# Patient Record
Sex: Male | Born: 1960
Health system: Southern US, Community
[De-identification: ages and names within clinical notes are randomized; demographics above are authoritative.]

## PROBLEM LIST (undated history)

## (undated) DIAGNOSIS — T7840XA Allergy, unspecified, initial encounter: Secondary | ICD-10-CM

## (undated) HISTORY — PX: TONSILLECTOMY: SUR1361

## (undated) HISTORY — DX: Allergy, unspecified, initial encounter: T78.40XA

---
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
## 2006-07-26 ENCOUNTER — Ambulatory Visit: Payer: Self-pay | Admitting: Urology

## 2008-04-24 IMAGING — CR DG IVP HYPERTENSIVE
1 series · 8 of 10 positions shown · non-contrast
Comparison: none

REASON FOR EXAM: UTI
COMMENTS:

[Series 1: view not recorded · 0.17mm/px · 8 of 13 slices shown]
[im 1/13]
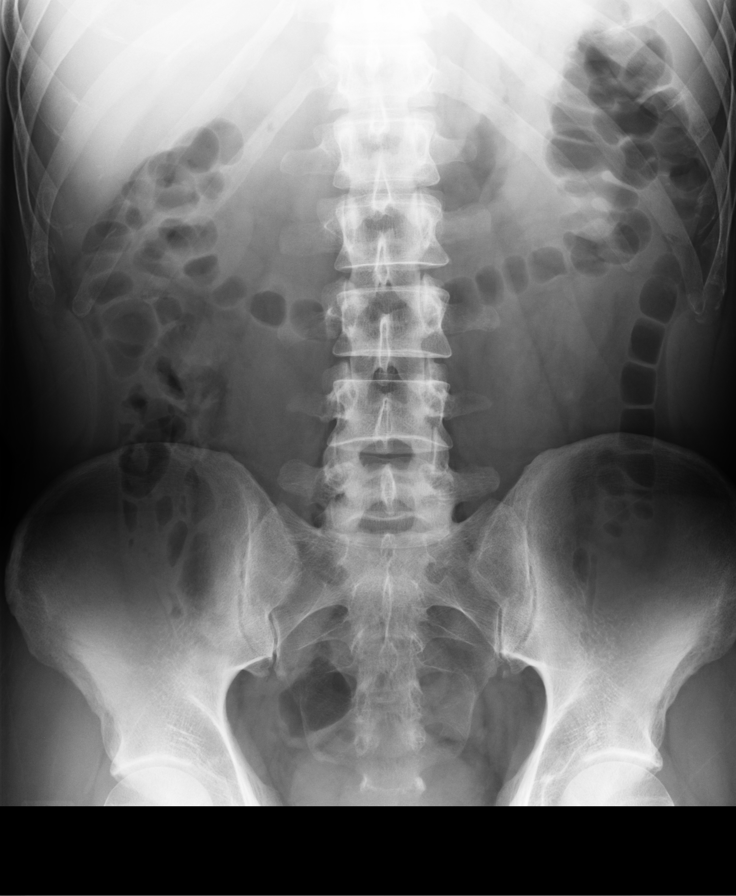
[im 2/13]
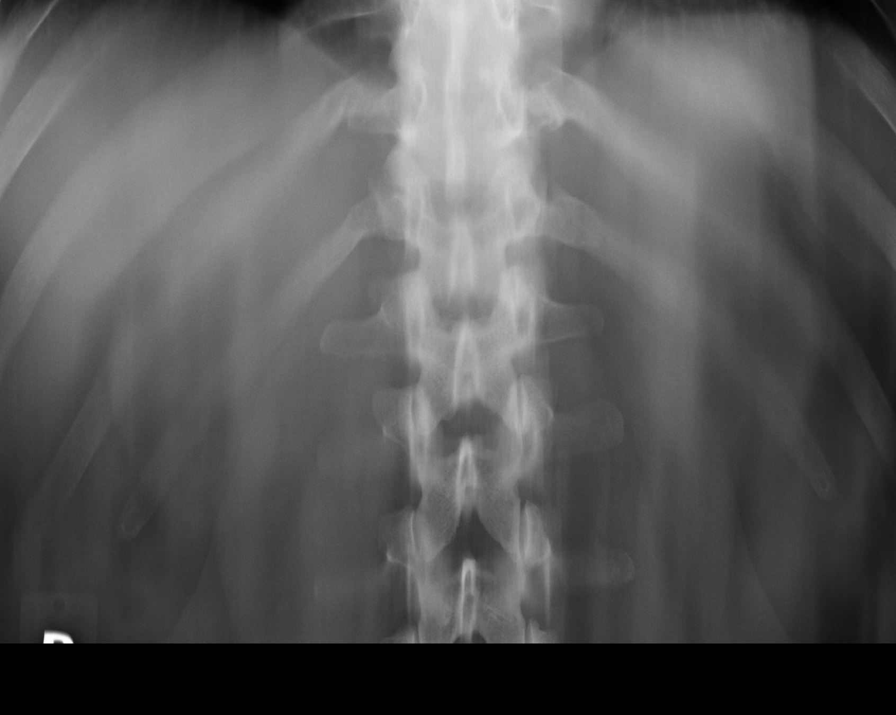
[im 3/13]
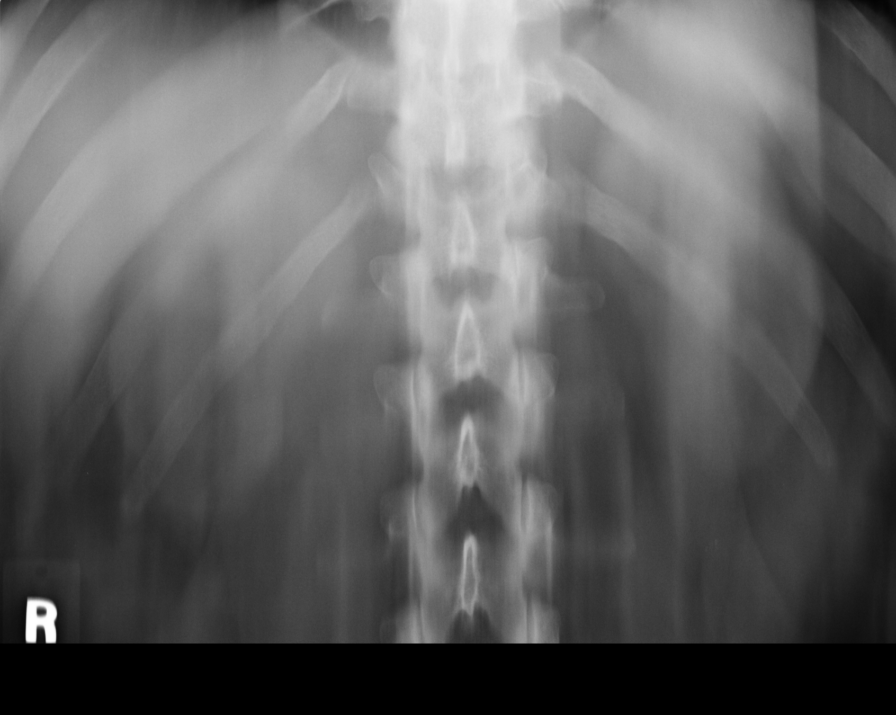
[im 5/13]
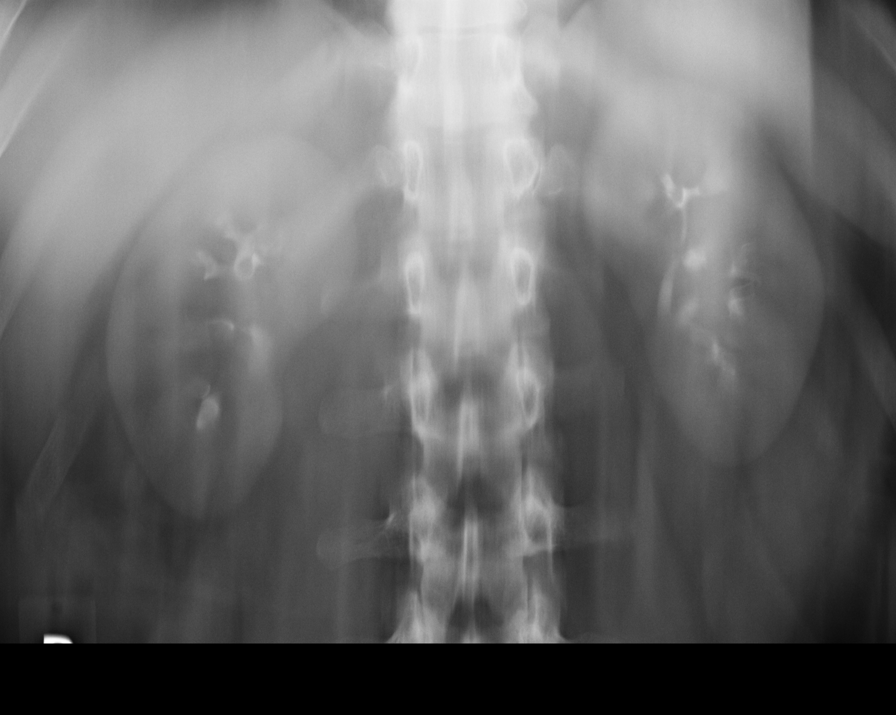
[im 6/13]
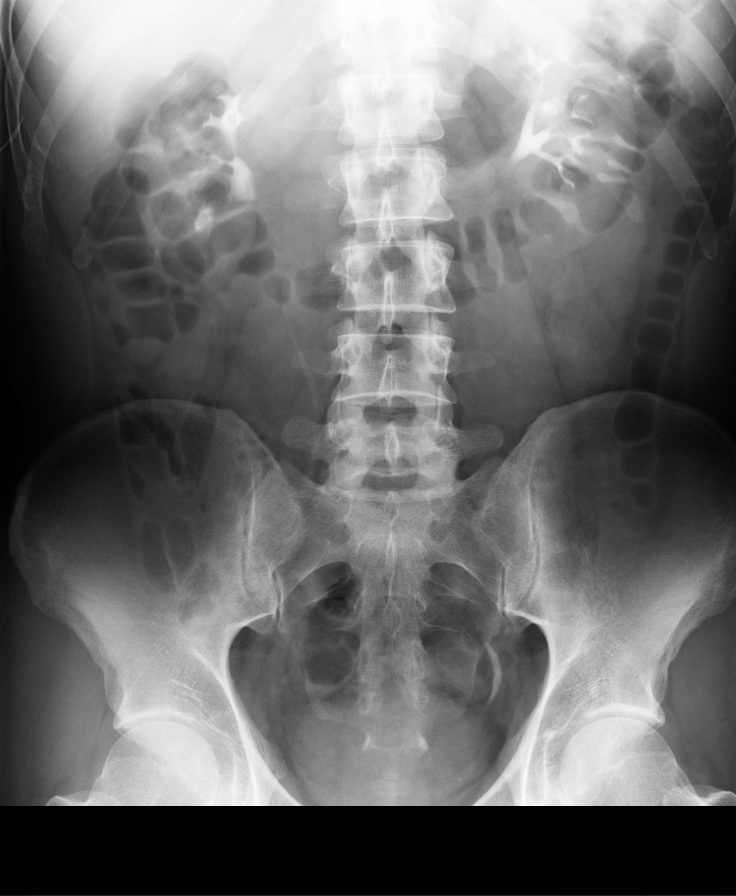
[im 7/13]
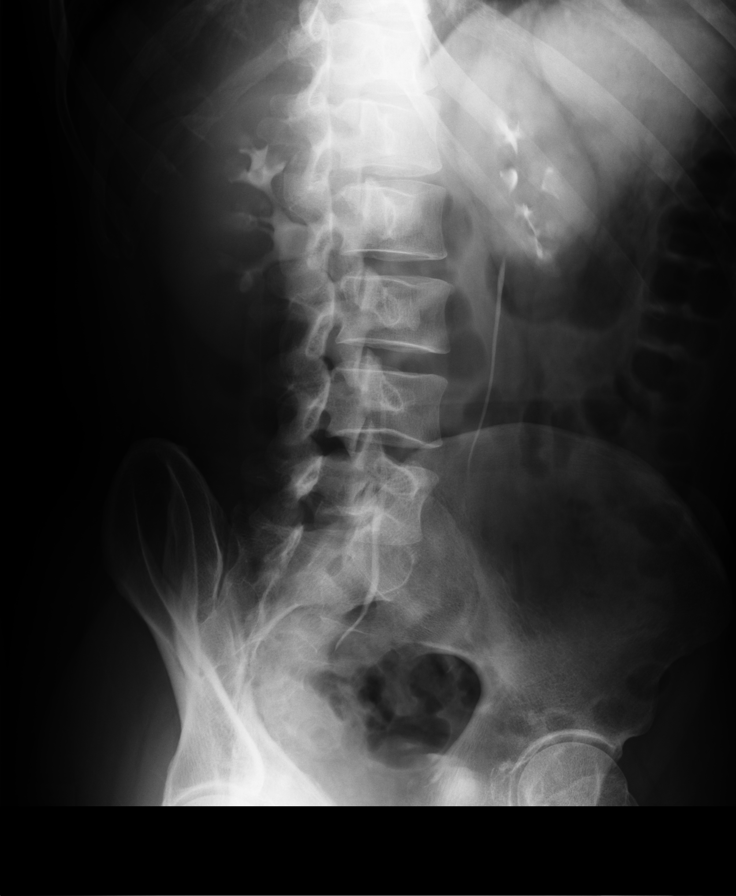
[im 9/13]
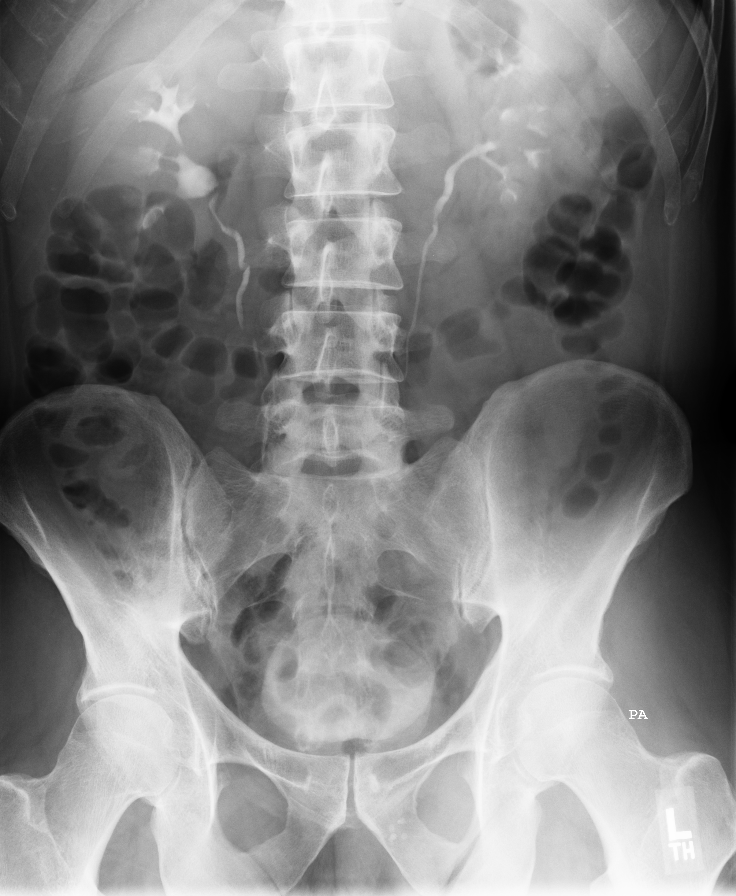
[im 10/13]
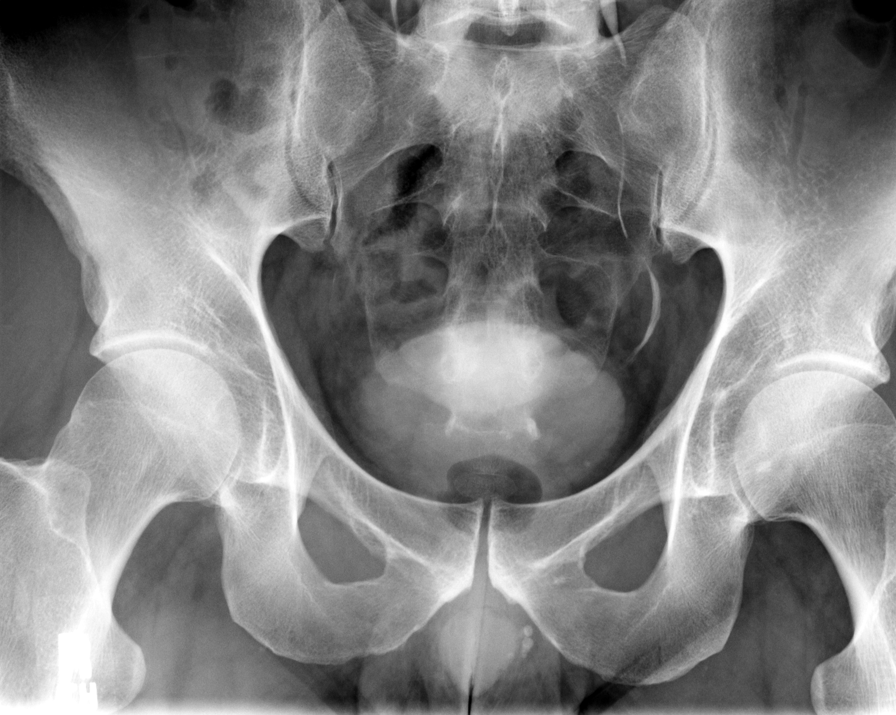

[8 of 10 positions shown; findings below may reference images not displayed]

PROCEDURE:     DXR - DXR INTRAVENOUS UROGRAPHY (IVP)  - July 26, 2006 [DATE]

RESULT:       Scout film reveals no abnormal calcifications. Post
intravenous injection of contrast, there is prompt excretion bilaterally.
The kidneys appear normal in size, shape and attitude.  The calices appear
sharp.  No blunting is seen.  The ureters appear within normal limits.   The
urinary bladder is well filled with no intrinsic abnormality seen.  On the
post-void film there is some residual contrast in the urinary bladder.  The
upper collecting system drains well.
IMPRESSION: No significant abnormalities noted on the IVP study.

## 2010-05-16 HISTORY — PX: SKIN LESION EXCISION: SHX2412

## 2014-02-14 ENCOUNTER — Encounter: Payer: Self-pay | Admitting: *Deleted

## 2014-02-20 ENCOUNTER — Ambulatory Visit (INDEPENDENT_AMBULATORY_CARE_PROVIDER_SITE_OTHER): Payer: Federal, State, Local not specified - PPO | Admitting: General Surgery

## 2014-02-20 ENCOUNTER — Encounter: Payer: Self-pay | Admitting: General Surgery

## 2014-02-20 VITALS — BP 136/68 | HR 72 | Resp 14 | Ht 70.0 in | Wt 181.0 lb

## 2014-02-20 DIAGNOSIS — L0211 Cutaneous abscess of neck: Secondary | ICD-10-CM

## 2014-02-20 DIAGNOSIS — L03221 Cellulitis of neck: Secondary | ICD-10-CM

## 2014-02-20 NOTE — Progress Notes (Signed)
Patient ID: Mark Catholic., male   DOB: 1960-12-09, 53 y.o.   MRN: [id number]  Chief Complaint  Patient presents with  . Cyst    left side of neck    HPI Mark Hill. is a 53 y.o. male.  Here today for evaluation of a boil on left side of his neck. States he noticed it a couple of months ago. Dermatology evaluated it and said it was a "cyst". Over the past two weeks it has gotten larger with occasional pain.  HPI  Past Medical History  Diagnosis Date  . Allergy     seasonal    Past Surgical History  Procedure Laterality Date  . Skin lesion excision  05-16-2010    back cyst    Family History  Problem Relation Age of Onset  . Diabetes Brother     Social History History  Substance Use Topics  . Smoking status: Never Smoker   . Smokeless tobacco: Never Used  . Alcohol Use: Yes     Comment: ocassionally    No Known Allergies  Current Outpatient Prescriptions  Medication Sig Dispense Refill  . ibuprofen (ADVIL,MOTRIN) 200 MG tablet Take 200 mg by mouth every 6 (six) hours as needed.      . saw palmetto 160 MG capsule Take 160 mg by mouth 3 (three) times daily.       No current facility-administered medications for this visit.    Review of Systems Review of Systems  Constitutional: Negative.   Respiratory: Negative.   Cardiovascular: Negative.     Blood pressure 136/68, pulse 72, resp. rate 14, height 5\' 10"  (1.778 m), weight 181 lb (82.101 kg).  Physical Exam Physical Exam  Constitutional: He is oriented to person, place, and time. He appears well-developed and well-nourished.  Neck:    Neurological: He is alert and oriented to person, place, and time.  Skin: Skin is warm and dry.  3 x 4 cm inflamed area base of left neck over SCM    Data Reviewed Back cyst excised August 2011 showed a ruptured follicular cyst, infundibular type.    Assessment    Inflamed dermal cyst.     Plan    Incision and drainage was recommended based on the  degree of overlying erythema. 10 cc of 0.5% Xylocaine with 0.25% Marcaine with 1-200,000 units of epinephrine was utilized a well tolerated. The area was prepped with ChloraPrep draped. A skin line incision was made over the mass about 2-1/2 cc of thick green pus obtained. Evidence of old cyst contents were removed. The area was cleansed with peroxide. Scant bleeding was noted.  A dry dressing was applied. The procedure was well tolerated.  Wound care was reviewed. If this heals without any residual thickening or discomfort reevaluation and formal excision of the cyst wall will not be required. The patient will notify the office of his progress. Follow up is not mandatory if he is doing well.     PCP: Mitchel Honour Jenae Tomasello 02/20/2014, 8:59 PM

## 2014-02-20 NOTE — Patient Instructions (Addendum)
The patient is aware to call back for any questions or concerns.  

## 2014-02-25 LAB — ANAEROBIC AND AEROBIC CULTURE

## 2014-03-08 ENCOUNTER — Ambulatory Visit: Payer: Self-pay | Admitting: General Surgery

## 2015-03-28 ENCOUNTER — Encounter: Payer: Self-pay | Admitting: *Deleted

## 2015-03-29 ENCOUNTER — Encounter: Payer: Self-pay | Admitting: *Deleted

## 2015-03-29 ENCOUNTER — Ambulatory Visit
Admission: RE | Admit: 2015-03-29 | Discharge: 2015-03-29 | Disposition: A | Discharge status: Home / Self Care | Payer: Federal, State, Local not specified - PPO | Source: Ambulatory Visit | Attending: Gastroenterology | Admitting: Gastroenterology

## 2015-03-29 ENCOUNTER — Encounter
Admission: RE | Disposition: A | Discharge status: Home / Self Care | Payer: Self-pay | Source: Ambulatory Visit | Attending: Gastroenterology

## 2015-03-29 ENCOUNTER — Ambulatory Visit: Payer: Federal, State, Local not specified - PPO | Admitting: Anesthesiology

## 2015-03-29 DIAGNOSIS — Z791 Long term (current) use of non-steroidal anti-inflammatories (NSAID): Secondary | ICD-10-CM | POA: Diagnosis not present

## 2015-03-29 DIAGNOSIS — D122 Benign neoplasm of ascending colon: Secondary | ICD-10-CM | POA: Insufficient documentation

## 2015-03-29 DIAGNOSIS — Z1211 Encounter for screening for malignant neoplasm of colon: Secondary | ICD-10-CM | POA: Diagnosis not present

## 2015-03-29 DIAGNOSIS — D124 Benign neoplasm of descending colon: Secondary | ICD-10-CM | POA: Insufficient documentation

## 2015-03-29 HISTORY — PX: COLONOSCOPY: SHX5424

## 2015-03-29 SURGERY — COLONOSCOPY
Anesthesia: General

## 2015-03-29 MED ORDER — LIDOCAINE HCL (CARDIAC) 20 MG/ML IV SOLN
INTRAVENOUS | Status: DC | PRN
  Administered 2015-03-29: 60 mg via INTRAVENOUS

## 2015-03-29 MED ORDER — PHENYLEPHRINE HCL 10 MG/ML IJ SOLN
INTRAMUSCULAR | Status: DC | PRN
  Administered 2015-03-29 (×4): 100 ug via INTRAVENOUS

## 2015-03-29 MED ORDER — SODIUM CHLORIDE 0.9 % IV SOLN
INTRAVENOUS | Status: DC | PRN
  Administered 2015-03-29: 11:00:00 via INTRAVENOUS

## 2015-03-29 MED ORDER — PROPOFOL INFUSION 10 MG/ML OPTIME
INTRAVENOUS | Status: DC | PRN
  Administered 2015-03-29: 120 ug/kg/min via INTRAVENOUS

## 2015-03-29 MED ORDER — SODIUM CHLORIDE 0.9 % IV SOLN: INTRAVENOUS | Status: DC

## 2015-03-29 MED ORDER — PROPOFOL 10 MG/ML IV BOLUS
INTRAVENOUS | Status: DC | PRN
  Administered 2015-03-29: 50 mg via INTRAVENOUS

## 2015-03-29 MED ORDER — SODIUM CHLORIDE 0.9 % IV SOLN
INTRAVENOUS | Status: DC
  Administered 2015-03-29: 1000 mL via INTRAVENOUS

## 2015-03-29 MED ORDER — MIDAZOLAM HCL 2 MG/2ML IJ SOLN
INTRAMUSCULAR | Status: DC | PRN
  Administered 2015-03-29: 2 mg via INTRAVENOUS

## 2015-03-29 NOTE — Anesthesia Preprocedure Evaluation (Signed)
Anesthesia Evaluation  Patient identified by MRN, date of birth, ID band Patient awake    Reviewed: Allergy & Precautions, NPO status , Patient's Chart, lab work & pertinent test results  Airway Mallampati: II  TM Distance: >3 FB Neck ROM: Full    Dental no notable dental hx.    Pulmonary  sinus   Pulmonary exam normal       Cardiovascular negative cardio ROS Normal cardiovascular exam    Neuro/Psych negative neurological ROS  negative psych ROS   GI/Hepatic negative GI ROS, Neg liver ROS,   Endo/Other  negative endocrine ROS  Renal/GU negative Renal ROS  negative genitourinary   Musculoskeletal negative musculoskeletal ROS (+)   Abdominal Normal abdominal exam  (+)   Peds negative pediatric ROS (+)  Hematology negative hematology ROS (+)   Anesthesia Other Findings   Reproductive/Obstetrics                             Anesthesia Physical Anesthesia Plan  ASA: II  Anesthesia Plan: General   Post-op Pain Management:    Induction: Intravenous  Airway Management Planned: Nasal Cannula  Additional Equipment:   Intra-op Plan:   Post-operative Plan:   Informed Consent: I have reviewed the patients History and Physical, chart, labs and discussed the procedure including the risks, benefits and alternatives for the proposed anesthesia with the patient or authorized representative who has indicated his/her understanding and acceptance.   Dental advisory given  Plan Discussed with: CRNA and Surgeon  Anesthesia Plan Comments:         Anesthesia Quick Evaluation

## 2015-03-29 NOTE — Anesthesia Postprocedure Evaluation (Signed)
  Anesthesia Post-op Note  Patient: Mark Hill.  Procedure(s) Performed: Procedure(s): COLONOSCOPY (N/A)  Anesthesia type:General  Patient location: PACU  Post pain: Pain level controlled  Post assessment: Post-op Vital signs reviewed, Patient's Cardiovascular Status Stable, Respiratory Function Stable, Patent Airway and No signs of Nausea or vomiting  Post vital signs: Reviewed and stable  Last Vitals:  Filed Vitals:   03/29/15 1240  BP: 121/82  Pulse: 70  Temp:   Resp: 16    Level of consciousness: awake, alert  and patient cooperative  Complications: No apparent anesthesia complications

## 2015-03-29 NOTE — Op Note (Signed)
[facility] Gastroenterology Patient Name: Mark Hill Procedure Date: 03/29/2015 11:12 AM MRN: [id number] Account #: [id number] Date of Birth: Mar 20, 1961 Admit Type: Outpatient Age: 54 Room: [facility] ENDO ROOM 4 Gender: Male Note Status: Finalized Procedure:         Colonoscopy Indications:       Screening for colorectal malignant neoplasm Providers:         Lollie Sails, MD Referring MD:      Janine Ores. Rosanna Randy, MD (Referring MD), Kirstie Peri. Caryn Section,                     MD (Referring MD) Medicines:         Monitored Anesthesia Care Complications:     No immediate complications. Procedure:         Pre-Anesthesia Assessment:                    - ASA Grade Assessment: II - A patient with mild systemic                     disease.                    After obtaining informed consent, the colonoscope was                     passed under direct vision. Throughout the procedure, the                     patient's blood pressure, pulse, and oxygen saturations                     were monitored continuously. The Colonoscope was                     introduced through the anus and advanced to the the cecum,                     identified by appendiceal orifice and ileocecal valve. The                     colonoscopy was performed without difficulty. The patient                     tolerated the procedure well. The quality of the bowel                     preparation was good. Findings:      A 3 mm polyp was found in the descending colon. The polyp was sessile.       The polyp was removed with a cold biopsy forceps. Resection and       retrieval were complete.      A 3 mm polyp was found in the proximal ascending colon. The polyp was       sessile. The polyp was removed with a cold biopsy forceps. Resection and       retrieval were complete.      A 12 mm polyp was found in the mid ascending colon. The polyp was       sessile. The polyp was removed with a cold biopsy  forceps. The polyp was       removed with a cold snare. The polyp was removed with a lift and cut       technique using a cold snare.  The polyp was removed with a piecemeal       technique using a cold snare. Resection and retrieval were complete.      Multiple small to medium diverticula were found in the sigmoid colon, in       the descending colon and in the ascending colon.      The digital rectal exam was normal.      The retroflexed view of the distal rectum and anal verge was normal and       showed no anal or rectal abnormalities. Impression:        - One 3 mm polyp in the descending colon. Resected and                     retrieved.                    - One 3 mm polyp in the proximal ascending colon. Resected                     and retrieved.                    - One 12 mm polyp in the mid ascending colon. Resected and                     retrieved. Recommendation:    - Await pathology results.                    - Telephone GI clinic for pathology results in 1 week. Procedure Code(s): --- Professional ---                    [phone], Colonoscopy, flexible; with removal of tumor(s),                     polyp(s), or other lesion(s) by snare technique                    45380, 72, Colonoscopy, flexible; with biopsy, single or                     multiple Diagnosis Code(s): --- Professional ---                    V76.51, Special screening for malignant neoplasms of colon                    211.3, Benign neoplasm of colon CPT copyright 2014 American Medical Association. All rights reserved. The codes documented in this report are preliminary and upon coder review may  be revised to meet current compliance requirements. Lollie Sails, MD 03/29/2015 11:58:41 AM This report has been signed electronically. Number of Addenda: 0 Note Initiated On: 03/29/2015 11:12 AM Scope Withdrawal Time: 0 hours 20 minutes 30 seconds  Total Procedure Duration: 0 hours 32 minutes 58 seconds        [facility]

## 2015-03-29 NOTE — H&P (Signed)
Outpatient short stay form Pre-procedure 03/29/2015 11:15 AM Lollie Sails MD  Primary Physician: Sheran Luz [facility]  Reason for visit:  Colonoscopy  History of present illness:  Patient is a 54 year old male presenting today for a screening colonoscopy. This is his first colonoscopy. He is not taking any anticoagulates or aspirin products. He tolerated his prep well.    Current facility-administered medications:  .  0.9 %  sodium chloride infusion, , Intravenous, Continuous, Lollie Sails, MD, Last Rate: 50 mL/hr at 03/29/15 1049, 1,000 mL at 03/29/15 1049  Prescriptions prior to admission  Medication Sig Dispense Refill Last Dose  . cetirizine (ZYRTEC) 10 MG tablet Take 10 mg by mouth daily.     Marland Kitchen ibuprofen (ADVIL,MOTRIN) 200 MG tablet Take 400 mg by mouth every 6 (six) hours as needed for headache.      . saw palmetto 160 MG capsule Take 160 mg by mouth 3 (three) times daily.        No Known Allergies   Past Medical History  Diagnosis Date  . Allergy     seasonal    Review of systems:      Physical Exam    Heart and lungs: Regular rate and rhythm without rub or gallop lungs are bilaterally clear    HEENT: Normocephalic atraumatic eyes are anicteric    Other:     Pertinant exam for procedure: Soft nontender nondistended bowel sounds positive normoactive    Planned proceedures: Colonoscopy and indicated procedures. I have discussed the risks benefits and complications of procedures to include not limited to bleeding, infection, perforation and the risk of sedation and the patient wishes to proceed.    Lollie Sails, MD Gastroenterology 03/29/2015  11:15 AM

## 2015-03-29 NOTE — Transfer of Care (Signed)
Immediate Anesthesia Transfer of Care Note  Patient: Mark Hill.  Procedure(s) Performed: Procedure(s): COLONOSCOPY (N/A)  Patient Location: Endoscopy Unit  Anesthesia Type:General  Level of Consciousness: awake, alert , oriented and patient cooperative  Airway & Oxygen Therapy: Patient Spontanous Breathing and Patient connected to nasal cannula oxygen  Post-op Assessment: Report given to RN, Post -op Vital signs reviewed and stable and Patient moving all extremities X 4  Post vital signs: Reviewed and stable  Last Vitals:  Filed Vitals:   03/29/15 1201  BP: 115/75  Pulse: 73  Temp: 36.5 C  Resp: 14    Complications: No apparent anesthesia complications

## 2015-04-01 ENCOUNTER — Encounter: Payer: Self-pay | Admitting: Gastroenterology

## 2015-04-01 LAB — SURGICAL PATHOLOGY

## 2015-07-30 ENCOUNTER — Ambulatory Visit (INDEPENDENT_AMBULATORY_CARE_PROVIDER_SITE_OTHER): Payer: Federal, State, Local not specified - PPO | Admitting: Family Medicine

## 2015-07-30 ENCOUNTER — Encounter: Payer: Self-pay | Admitting: Family Medicine

## 2015-07-30 VITALS — BP 104/60 | HR 68 | Temp 99.9°F | Resp 16 | Wt 190.0 lb

## 2015-07-30 DIAGNOSIS — Z125 Encounter for screening for malignant neoplasm of prostate: Secondary | ICD-10-CM | POA: Diagnosis not present

## 2015-07-30 DIAGNOSIS — N390 Urinary tract infection, site not specified: Secondary | ICD-10-CM | POA: Diagnosis not present

## 2015-07-30 DIAGNOSIS — N3090 Cystitis, unspecified without hematuria: Secondary | ICD-10-CM

## 2015-07-30 LAB — POCT URINALYSIS DIPSTICK
Bilirubin, UA: NEGATIVE
Glucose, UA: NEGATIVE
Ketones, UA: NEGATIVE
NITRITE UA: NEGATIVE
PH UA: 8.5
Protein, UA: 30
Urobilinogen, UA: 0.2

## 2015-07-30 MED ORDER — CIPROFLOXACIN HCL 500 MG PO TABS: 500.0000 mg | ORAL_TABLET | Freq: Two times a day (BID) | ORAL | Status: AC

## 2015-07-30 NOTE — Progress Notes (Signed)
       Patient: Mark Hill. Male    DOB: [birth year]   54 y.o.   MRN: [id number] Visit Date: 07/30/2015  Today's Provider: Lelon Huh, MD   Chief Complaint  Patient presents with  . Urinary Frequency  . Chills   Subjective:    HPI 2 days history urinary frequency, urgency, and hesitancy. Had one episode of vomiting last night, but since then has had no nausea or other abdominal pains. Has had some mild chills and sweats. No back pain. He does have a history of recurrent prostatitis and UTIs.    No Known Allergies Previous Medications   CETIRIZINE (ZYRTEC) 10 MG TABLET    Take 10 mg by mouth daily.   IBUPROFEN (ADVIL,MOTRIN) 200 MG TABLET    Take 400 mg by mouth every 6 (six) hours as needed for headache.    SAW PALMETTO 160 MG CAPSULE    Take 160 mg by mouth 3 (three) times daily.    Review of Systems  Constitutional: Positive for fever.  Cardiovascular: Negative for chest pain and palpitations.  Gastrointestinal: Negative for abdominal pain.  Genitourinary: Positive for urgency, frequency, decreased urine volume and difficulty urinating. Negative for flank pain.  Neurological: Negative for dizziness, light-headedness and headaches.    Social History  Substance Use Topics  . Smoking status: Never Smoker   . Smokeless tobacco: Never Used  . Alcohol Use: Yes     Comment: ocassionally   Objective:   BP 104/60 mmHg  Pulse 68  Temp(Src) 99.9 F (37.7 C) (Oral)  Resp 16  Wt 190 lb (86.183 kg)  SpO2 97%     Physical Exam   General Appearance:    Alert, cooperative, no distress  Eyes:    PERRL, conjunctiva/corneas clear, EOM's intact       Lungs:     Clear to auscultation bilaterally, respirations unlabored  Heart:    Regular rate and rhythm  Neurologic:   Awake, alert, oriented x 3. No apparent focal neurological           defect.   Abd:    Mild suprapubic tenderness, mild CVAT.    Results for orders placed or performed in visit on 07/30/15  POCT  urinalysis dipstick  Result Value Ref Range   Color, UA Dark Yellow    Clarity, UA Turbid    Glucose, UA Neg    Bilirubin, UA Neg    Ketones, UA Neg    Spec Grav, UA <=1.005    Blood, UA Trace    pH, UA 8.5    Protein, UA 30    Urobilinogen, UA 0.2    Nitrite, UA Neg    Leukocytes, UA large (3+) (A) Negative       Assessment & Plan:     1. Urinary tract infection without hematuria, site unspecified He does have long history of recurrent prostate and urinary tract infections and had evaluation by Dr. Bernardo Heater in 2007, but has now had 4 UTIs since July 2015. Will Check PSA when acute infection is cleared, and consider referral back to urology.  - POCT urinalysis dipstick - Urine culture - ciprofloxacin (CIPRO) 500 MG tablet; Take 1 tablet (500 mg total) by mouth 2 (two) times daily.  Dispense: 20 tablet; Refill: 0  2. Prostate cancer screening Will check PSA after finishing antibiotic and current symptoms have resolved.  - PSA       Lelon Huh, MD  [facility]

## 2015-07-30 NOTE — Addendum Note (Signed)
Addended by: Julieta Bellini on: 07/30/2015 11:27 AM   Modules accepted: Miquel Dunn

## 2015-08-01 LAB — URINE CULTURE

## 2015-08-20 ENCOUNTER — Telehealth: Payer: Self-pay | Admitting: Family Medicine

## 2015-08-20 LAB — PSA: PROSTATE SPECIFIC AG, SERUM: 4.8 ng/mL — AB (ref 0.0–4.0)

## 2015-08-20 NOTE — Telephone Encounter (Signed)
Pt is returning call.  CB#[phone]/MW

## 2015-08-21 NOTE — Telephone Encounter (Signed)
Patient returning call. Patient request a call back within 30 minutes or tomorrow after lunch due to work.

## 2015-08-21 NOTE — Telephone Encounter (Signed)
Patient notified

## 2015-08-21 NOTE — Telephone Encounter (Signed)
Returned call, no answer. Vm is full.

## 2015-08-21 NOTE — Telephone Encounter (Signed)
Pt is returning call.  CB#[phone]/MW

## 2015-12-02 ENCOUNTER — Encounter: Payer: Self-pay | Admitting: Family Medicine

## 2015-12-02 ENCOUNTER — Ambulatory Visit (INDEPENDENT_AMBULATORY_CARE_PROVIDER_SITE_OTHER): Payer: Federal, State, Local not specified - PPO | Admitting: Family Medicine

## 2015-12-02 VITALS — BP 110/60 | HR 94 | Temp 98.8°F | Resp 16 | Wt 193.0 lb

## 2015-12-02 DIAGNOSIS — N3090 Cystitis, unspecified without hematuria: Secondary | ICD-10-CM

## 2015-12-02 DIAGNOSIS — R300 Dysuria: Secondary | ICD-10-CM | POA: Diagnosis not present

## 2015-12-02 DIAGNOSIS — N3080 Other cystitis without hematuria: Secondary | ICD-10-CM | POA: Diagnosis not present

## 2015-12-02 LAB — POCT URINALYSIS DIPSTICK
Bilirubin, UA: NEGATIVE
Glucose, UA: NEGATIVE
Ketones, UA: NEGATIVE
Nitrite, UA: POSITIVE
PH UA: 6
Protein, UA: NEGATIVE
SPEC GRAV UA: 1.02
Urobilinogen, UA: 0.2

## 2015-12-02 MED ORDER — CIPROFLOXACIN HCL 500 MG PO TABS: 500.0000 mg | ORAL_TABLET | Freq: Two times a day (BID) | ORAL | Status: AC

## 2015-12-02 NOTE — Progress Notes (Signed)
Patient: Mark Hill. Male    DOB: December 28, 1960   55 y.o.   MRN: [id number] Visit Date: 12/02/2015  Today's Provider: Lelon Huh, MD   Chief Complaint  Patient presents with  . Dysuria   Subjective:    Dysuria  This is a new problem. The current episode started yesterday (last night). The problem occurs every urination. The problem has been unchanged. The patient is experiencing no pain. Maximum temperature: patient states he feels feverish. Pertinent negatives include no chills, discharge, flank pain, frequency, hematuria, hesitancy, nausea, sweats, urgency or vomiting. He has tried nothing for the symptoms.  Patient believes he has a prostate infection. He states he has had one in the past and this feels similar. Patient symptoms include cloudy urine with and odor and a slower urine stream. Patient denies any pain during urination.      No Known Allergies Previous Medications   CETIRIZINE (ZYRTEC) 10 MG TABLET    Take 10 mg by mouth daily.   IBUPROFEN (ADVIL,MOTRIN) 200 MG TABLET    Take 400 mg by mouth every 6 (six) hours as needed for headache.    SAW PALMETTO 160 MG CAPSULE    Take 160 mg by mouth 3 (three) times daily.    Review of Systems  Constitutional: Negative for fever, chills and appetite change.  Respiratory: Negative for chest tightness, shortness of breath and wheezing.   Cardiovascular: Negative for chest pain and palpitations.  Gastrointestinal: Negative for nausea, vomiting and abdominal pain.  Genitourinary: Positive for dysuria. Negative for hesitancy, urgency, frequency, hematuria and flank pain.    Social History  Substance Use Topics  . Smoking status: Never Smoker   . Smokeless tobacco: Never Used  . Alcohol Use: 0.0 oz/week    0 Standard drinks or equivalent per week     Comment: ocassionally on weekends   Objective:   BP 110/60 mmHg  Pulse 94  Temp(Src) 98.8 F (37.1 C) (Oral)  Resp 16  Wt 193 lb (87.544 kg)  SpO2  99%  Physical Exam  General appearance: alert, well developed, well nourished, cooperative and in no distress Head: Normocephalic, without obvious abnormality, atraumatic Abd: No CVAT Skin: Skin color, texture, turgor normal. No rashes seen  Psych: Appropriate mood and affect. Neurologic: Mental status: Alert, oriented to person, place, and time, thought content appropriate.   Results for orders placed or performed in visit on 12/02/15  POCT urinalysis dipstick  Result Value Ref Range   Color, UA yellow    Clarity, UA cloudy    Glucose, UA negative    Bilirubin, UA negative    Ketones, UA negative    Spec Grav, UA 1.020    Blood, UA Moderate (non hemolyzed)    pH, UA 6.0    Protein, UA negative    Urobilinogen, UA 0.2    Nitrite, UA positive    Leukocytes, UA small (1+) (A) Negative        Assessment & Plan:     1. Dysuria  - POCT urinalysis dipstick  2. Recurrent cystitis Before today most recent UTI was in November and PSA was mildly elevated at 4.9. He was advised to follow up with urologist, but his daughter is carrying twins and has had multiple medical problems and hospitalizations causing him to delay referral. He states he call for appointment with urologist in the next several weeks once his daughters pregnancy is over.  - Urine Culture - ciprofloxacin (CIPRO) 500 MG  tablet; Take 1 tablet (500 mg total) by mouth 2 (two) times daily.  Dispense: 20 tablet; Refill: 0       Lelon Huh, MD  [facility]

## 2015-12-04 ENCOUNTER — Other Ambulatory Visit: Payer: Self-pay | Admitting: Family Medicine

## 2015-12-04 LAB — URINE CULTURE

## 2015-12-04 MED ORDER — NITROFURANTOIN MONOHYD MACRO 100 MG PO CAPS: 100.0000 mg | ORAL_CAPSULE | Freq: Two times a day (BID) | ORAL | Status: AC

## 2015-12-18 ENCOUNTER — Other Ambulatory Visit: Payer: Self-pay | Admitting: Family Medicine

## 2015-12-18 MED ORDER — NITROFURANTOIN MACROCRYSTAL 100 MG PO CAPS: 100.0000 mg | ORAL_CAPSULE | Freq: Two times a day (BID) | ORAL | Status: DC

## 2015-12-18 NOTE — Telephone Encounter (Signed)
Pt states he was seen 2 weeks ago and was given a Rx to help with burning when voiding.  Pt states he has completed the Rx and is now having the symptoms again.  [facility].  CB#[phone]/MW

## 2015-12-18 NOTE — Telephone Encounter (Signed)
The infection was resistant to most antibiotics and will become untreatable if he doesn't see a urologist to take care of the underlying problem. Can try another round of nitrofurantoin 100mg  twice a day for 10 days, but he really needs to follow up with urology soon.

## 2015-12-18 NOTE — Telephone Encounter (Signed)
Patient was advised and expressed understanding. Patient requested rx be sent to pharmacy. Rx sent.

## 2016-01-08 DIAGNOSIS — N500 Atrophy of testis: Secondary | ICD-10-CM | POA: Diagnosis not present

## 2016-01-08 DIAGNOSIS — R3914 Feeling of incomplete bladder emptying: Secondary | ICD-10-CM | POA: Diagnosis not present

## 2016-01-08 DIAGNOSIS — R972 Elevated prostate specific antigen [PSA]: Secondary | ICD-10-CM | POA: Diagnosis not present

## 2016-01-08 DIAGNOSIS — N401 Enlarged prostate with lower urinary tract symptoms: Secondary | ICD-10-CM | POA: Diagnosis not present

## 2016-02-11 DIAGNOSIS — N401 Enlarged prostate with lower urinary tract symptoms: Secondary | ICD-10-CM | POA: Diagnosis not present

## 2016-02-14 DIAGNOSIS — R338 Other retention of urine: Secondary | ICD-10-CM | POA: Diagnosis not present

## 2016-02-14 DIAGNOSIS — N390 Urinary tract infection, site not specified: Secondary | ICD-10-CM | POA: Diagnosis not present

## 2016-02-14 DIAGNOSIS — N401 Enlarged prostate with lower urinary tract symptoms: Secondary | ICD-10-CM | POA: Diagnosis not present

## 2016-07-14 ENCOUNTER — Ambulatory Visit (INDEPENDENT_AMBULATORY_CARE_PROVIDER_SITE_OTHER): Payer: Federal, State, Local not specified - PPO | Admitting: Family Medicine

## 2016-07-14 ENCOUNTER — Encounter: Payer: Self-pay | Admitting: Family Medicine

## 2016-07-14 VITALS — BP 120/70 | HR 71 | Temp 98.1°F | Resp 16 | Wt 188.0 lb

## 2016-07-14 DIAGNOSIS — N390 Urinary tract infection, site not specified: Secondary | ICD-10-CM

## 2016-07-14 DIAGNOSIS — R350 Frequency of micturition: Secondary | ICD-10-CM | POA: Diagnosis not present

## 2016-07-14 LAB — POCT URINALYSIS DIPSTICK
Bilirubin, UA: NEGATIVE
Glucose, UA: NEGATIVE
Ketones, UA: NEGATIVE
Nitrite, UA: POSITIVE
Spec Grav, UA: 1.02
Urobilinogen, UA: NEGATIVE
pH, UA: 6

## 2016-07-14 MED ORDER — AMOXICILLIN-POT CLAVULANATE 875-125 MG PO TABS: 1.0000 | ORAL_TABLET | Freq: Two times a day (BID) | ORAL | 0 refills | Status: AC

## 2016-07-14 NOTE — Progress Notes (Signed)
Patient: Mark Hill. Male    DOB: 12-Dec-1960   55 y.o.   MRN: [id number] Visit Date: 07/14/2016  Today's Provider: Lelon Huh, MD   Chief Complaint  Patient presents with  . Urinary Frequency   Subjective:    Patient has burning and frequency with urination. Symptoms started 07/12/16. Patient stated that his symptoms have improved somewhat. Patient does see Dr Eliberto Ivory who diagnosis patient with a slightly enlarged prostate.     Urinary Frequency   This is a recurrent problem. The current episode started in the past 7 days (2 days). The problem occurs every urination. The problem has been gradually improving. The quality of the pain is described as burning. The pain is at a severity of 5/10. The pain is moderate. The maximum temperature recorded prior to his arrival was 100 - 100.9 F. Associated symptoms include frequency. Pertinent negatives include no chills, discharge, flank pain, hematuria, hesitancy, nausea, possible pregnancy, sweats, urgency or vomiting. slightly enlarge prostate   He has history recurrent UTI secondary to BPH followed by Dr. Eliberto Ivory, improve since started on Rapaflox and finasteride.    No Known Allergies   Current Outpatient Prescriptions:  .  cetirizine (ZYRTEC) 10 MG tablet, Take 10 mg by mouth daily., Disp: , Rfl:  .  finasteride (PROSCAR) 5 MG tablet, Take 1 tablet by mouth daily., Disp: , Rfl:  .  ibuprofen (ADVIL,MOTRIN) 200 MG tablet, Take 400 mg by mouth every 6 (six) hours as needed for headache. , Disp: , Rfl:  .  RAPAFLO 8 MG CAPS capsule, Take 1 capsule by mouth daily., Disp: , Rfl:   Review of Systems  Constitutional: Negative for appetite change, chills and fever.  Respiratory: Negative for chest tightness, shortness of breath and wheezing.   Cardiovascular: Negative for chest pain and palpitations.  Gastrointestinal: Negative for abdominal pain, nausea and vomiting.  Genitourinary: Positive for dysuria and frequency. Negative for  flank pain, hematuria, hesitancy and urgency.    Social History  Substance Use Topics  . Smoking status: Never Smoker  . Smokeless tobacco: Never Used  . Alcohol use 0.0 oz/week     Comment: ocassionally on weekends   Objective:   BP 120/70 (BP Location: Left Arm, Patient Position: Sitting, Cuff Size: Large)   Pulse 71   Temp 98.1 F (36.7 C) (Oral)   Resp 16   Wt 188 lb (85.3 kg)   SpO2 98%   BMI 26.98 kg/m   Physical Exam  General appearance: alert, well developed, well nourished, cooperative and in no distress Head: Normocephalic, without obvious abnormality, atraumatic Respiratory: Respirations even and unlabored, normal respiratory rate Extremities: No gross deformities Skin: Skin color, texture, turgor normal. No rashes seen  Psych: Appropriate mood and affect. Neurologic: Mental status: Alert, oriented to person, place, and time, thought content appropriate.     Assessment & Plan:     1. Urinary tract infection without hematuria, site unspecified Secondary to BPH.  Sx are improving and advised infection may resolve without ab. He is to start taking abx if any sx return before he follows up with Dr Eliberto Ivory in November.  - amoxicillin-clavulanate (AUGMENTIN) 875-125 MG tablet; Take 1 tablet by mouth 2 (two) times daily.  Dispense: 14 tablet; Refill: 0  2. Urine frequency  - POCT urinalysis dipstick      The entirety of the information documented in the History of Present Illness, Review of Systems and Physical Exam were personally obtained by me.  Portions of this information were initially documented by April M. Sabra Heck, CMA and reviewed by me for thoroughness and accuracy.    Lelon Huh, MD  [facility]

## 2016-07-16 LAB — URINE CULTURE

## 2016-08-25 DIAGNOSIS — R3915 Urgency of urination: Secondary | ICD-10-CM | POA: Diagnosis not present

## 2016-08-25 DIAGNOSIS — N401 Enlarged prostate with lower urinary tract symptoms: Secondary | ICD-10-CM | POA: Diagnosis not present

## 2016-09-15 ENCOUNTER — Ambulatory Visit (INDEPENDENT_AMBULATORY_CARE_PROVIDER_SITE_OTHER): Payer: Federal, State, Local not specified - PPO | Admitting: Family Medicine

## 2016-09-15 ENCOUNTER — Encounter: Payer: Self-pay | Admitting: Family Medicine

## 2016-09-15 VITALS — BP 112/62 | HR 64 | Temp 97.6°F | Resp 16 | Wt 191.6 lb

## 2016-09-15 DIAGNOSIS — N390 Urinary tract infection, site not specified: Secondary | ICD-10-CM

## 2016-09-15 LAB — POCT URINALYSIS DIPSTICK
Blood, UA: NEGATIVE
GLUCOSE UA: NEGATIVE
KETONES UA: NEGATIVE
Nitrite, UA: POSITIVE
PROTEIN UA: NEGATIVE
Spec Grav, UA: 1.015
Urobilinogen, UA: 0.2
pH, UA: 5

## 2016-09-15 MED ORDER — SULFAMETHOXAZOLE-TRIMETHOPRIM 800-160 MG PO TABS: 1.0000 | ORAL_TABLET | Freq: Two times a day (BID) | ORAL | 1 refills | Status: DC

## 2016-09-15 NOTE — Patient Instructions (Signed)
We will call you with the culture report. Push fluids. Hold off on antibiotic until culture available if you remain without symptoms.

## 2016-09-15 NOTE — Progress Notes (Signed)
Subjective:     Patient ID: Dellie Catholic., male   DOB: July 09, 1961, 55 y.o.   MRN: [id number]  HPI  Chief Complaint  Patient presents with  . Urine Output    Patient comes in office today with cocnerns of urinary odor for the past 3 days. Patient denies any symptoms of dysuria, back pain or frequecy  Hx of BPH with last urine culture in October with E. Coli.   Review of Systems     Objective:   Physical Exam  Constitutional: He appears well-developed and well-nourished. No distress.  Genitourinary:  Genitourinary Comments: No c.v. tenderness       Assessment:    1. Urinary tract infection without hematuria, site unspecified - Urine culture - POCT urinalysis dipstick - sulfamethoxazole-trimethoprim (BACTRIM DS,SEPTRA DS) 800-160 MG tablet; Take 1 tablet by mouth 2 (two) times daily.  Dispense: 14 tablet; Refill: 1    Plan:    Hold abx if continued asymptomatic pending culture results.

## 2016-09-16 LAB — URINE CULTURE: ORGANISM ID, BACTERIA: NO GROWTH

## 2016-09-17 ENCOUNTER — Telehealth: Payer: Self-pay

## 2016-09-17 NOTE — Telephone Encounter (Signed)
-----   Message from Carmon Ginsberg, [place] sent at 09/17/2016  7:35 AM EST ----- No organism on culture. Do NOT start antibiotic if no symptoms.

## 2016-09-17 NOTE — Telephone Encounter (Signed)
Patients wife was advised. KW 

## 2016-12-10 DIAGNOSIS — D2371 Other benign neoplasm of skin of right lower limb, including hip: Secondary | ICD-10-CM | POA: Diagnosis not present

## 2016-12-10 DIAGNOSIS — M79672 Pain in left foot: Secondary | ICD-10-CM | POA: Diagnosis not present

## 2016-12-10 DIAGNOSIS — M79671 Pain in right foot: Secondary | ICD-10-CM | POA: Diagnosis not present

## 2016-12-10 DIAGNOSIS — B351 Tinea unguium: Secondary | ICD-10-CM | POA: Diagnosis not present

## 2016-12-24 DIAGNOSIS — M79672 Pain in left foot: Secondary | ICD-10-CM | POA: Diagnosis not present

## 2016-12-24 DIAGNOSIS — D2371 Other benign neoplasm of skin of right lower limb, including hip: Secondary | ICD-10-CM | POA: Diagnosis not present

## 2016-12-24 DIAGNOSIS — M79671 Pain in right foot: Secondary | ICD-10-CM | POA: Diagnosis not present

## 2017-04-02 DIAGNOSIS — N401 Enlarged prostate with lower urinary tract symptoms: Secondary | ICD-10-CM | POA: Diagnosis not present

## 2017-04-02 DIAGNOSIS — R351 Nocturia: Secondary | ICD-10-CM | POA: Diagnosis not present

## 2017-04-02 DIAGNOSIS — R350 Frequency of micturition: Secondary | ICD-10-CM | POA: Diagnosis not present

## 2017-04-02 DIAGNOSIS — R972 Elevated prostate specific antigen [PSA]: Secondary | ICD-10-CM | POA: Diagnosis not present

## 2017-04-27 ENCOUNTER — Encounter: Payer: Self-pay | Admitting: Family Medicine

## 2017-04-27 ENCOUNTER — Ambulatory Visit (INDEPENDENT_AMBULATORY_CARE_PROVIDER_SITE_OTHER): Payer: Federal, State, Local not specified - PPO | Admitting: Family Medicine

## 2017-04-27 VITALS — BP 124/80 | HR 81 | Temp 98.2°F | Resp 16 | Wt 193.4 lb

## 2017-04-27 DIAGNOSIS — N3001 Acute cystitis with hematuria: Secondary | ICD-10-CM

## 2017-04-27 LAB — POCT URINALYSIS DIPSTICK
Bilirubin, UA: NEGATIVE
Glucose, UA: NEGATIVE
Ketones, UA: NEGATIVE
Nitrite, UA: POSITIVE
Spec Grav, UA: <=1.005 — AB
Urobilinogen, UA: 0.2 U/dL
pH, UA: 5

## 2017-04-27 MED ORDER — SULFAMETHOXAZOLE-TRIMETHOPRIM 800-160 MG PO TABS: 1.0000 | ORAL_TABLET | Freq: Two times a day (BID) | ORAL | 1 refills | Status: DC

## 2017-04-27 NOTE — Patient Instructions (Signed)
We will call you with the urine culture results. 

## 2017-04-27 NOTE — Addendum Note (Signed)
Addended by: Minette Headland on: 04/27/2017 03:20 PM   Modules accepted: Orders

## 2017-04-27 NOTE — Progress Notes (Signed)
Subjective:     Patient ID: Dellie Catholic., male   DOB: 08/22/1961, 56 y.o.   MRN: [id number]  HPI  Chief Complaint  Patient presents with  . Urinary Tract Infection    Patient comes in office today with concerns of a strong urinary odor that he noticed this morning. Patient denies symptoms of groin pain, back pain, fever, dysuria or urgency/frequency.   Has hx of BPH with prior UTI's. States with his current medication for prostate enlargement he has no nocturia. Will be seeing his urologist, Dr. Rogers Blocker, next month for routine f/u.   Review of Systems     Objective:   Physical Exam  Constitutional: He appears well-developed and well-nourished. No distress.  Genitourinary:  Genitourinary Comments: No cva tenderness       Assessment:    1. Acute cystitis with hematuria - sulfamethoxazole-trimethoprim (BACTRIM DS,SEPTRA DS) 800-160 MG tablet; Take 1 tablet by mouth 2 (two) times daily.  Dispense: 14 tablet; Refill: 1 - POCT urinalysis dipstick - Urine Culture    Plan:    Further f/u pending culture results.

## 2017-04-28 ENCOUNTER — Other Ambulatory Visit: Payer: Self-pay | Admitting: Family Medicine

## 2017-04-30 LAB — URINE CULTURE

## 2017-07-26 ENCOUNTER — Encounter: Payer: Self-pay | Admitting: Family Medicine

## 2017-07-26 ENCOUNTER — Ambulatory Visit (INDEPENDENT_AMBULATORY_CARE_PROVIDER_SITE_OTHER): Payer: Federal, State, Local not specified - PPO | Admitting: Family Medicine

## 2017-07-26 VITALS — BP 124/80 | HR 60 | Temp 97.6°F | Resp 18 | Wt 188.0 lb

## 2017-07-26 DIAGNOSIS — J300 Vasomotor rhinitis: Secondary | ICD-10-CM

## 2017-07-26 DIAGNOSIS — R059 Cough, unspecified: Secondary | ICD-10-CM

## 2017-07-26 DIAGNOSIS — R05 Cough: Secondary | ICD-10-CM | POA: Diagnosis not present

## 2017-07-26 DIAGNOSIS — M778 Other enthesopathies, not elsewhere classified: Secondary | ICD-10-CM

## 2017-07-26 DIAGNOSIS — M779 Enthesopathy, unspecified: Secondary | ICD-10-CM

## 2017-07-26 MED ORDER — MONTELUKAST SODIUM 10 MG PO TABS: 10.0000 mg | ORAL_TABLET | Freq: Every day | ORAL | 3 refills | Status: DC

## 2017-07-26 NOTE — Progress Notes (Signed)
Patient: Mark Hill. Male    DOB: Jul 07, 1961   56 y.o.   MRN: [id number] Visit Date: 07/26/2017  Today's Provider: Lelon Huh, MD   Chief Complaint  Patient presents with  . Cough   Subjective:    Cough  This is a new problem. Episode onset: 1 week ago. The problem has been unchanged. Cough characteristics: pale yellow sputum. Associated symptoms include headaches (last night) and postnasal drip. Pertinent negatives include no chest pain, chills, ear congestion, ear pain, fever, heartburn, hemoptysis, myalgias, nasal congestion, rash, rhinorrhea, sore throat, shortness of breath, sweats or wheezing. Nothing aggravates the symptoms. Treatments tried: OTC Mucus relief. The treatment provided no relief.  No nasal or sinus congestion. Very little nasal drainage.      No Known Allergies   Current Outpatient Prescriptions:  .  cetirizine (ZYRTEC) 10 MG tablet, Take 10 mg by mouth daily., Disp: , Rfl:  .  finasteride (PROSCAR) 5 MG tablet, Take 1 tablet by mouth daily., Disp: , Rfl:  .  ibuprofen (ADVIL,MOTRIN) 200 MG tablet, Take 400 mg by mouth every 6 (six) hours as needed for headache. , Disp: , Rfl:  .  RAPAFLO 8 MG CAPS capsule, Take 1 capsule by mouth daily., Disp: , Rfl:   Review of Systems  Constitutional: Positive for fatigue. Negative for appetite change, chills and fever.  HENT: Positive for postnasal drip. Negative for congestion, ear pain, rhinorrhea, sinus pain, sinus pressure, sneezing and sore throat.   Eyes: Negative for discharge and itching.  Respiratory: Positive for cough. Negative for hemoptysis, chest tightness, shortness of breath and wheezing.   Cardiovascular: Negative for chest pain and palpitations.  Gastrointestinal: Negative for abdominal pain, heartburn, nausea and vomiting.  Musculoskeletal: Negative for myalgias.  Skin: Negative for rash.  Neurological: Positive for headaches (last night).    Social History  Substance Use Topics    . Smoking status: Never Smoker  . Smokeless tobacco: Never Used  . Alcohol use 0.0 oz/week     Comment: ocassionally on weekends   Objective:   BP 124/80 (BP Location: Left Arm, Patient Position: Sitting, Cuff Size: Large)   Pulse 60   Temp 97.6 F (36.4 C) (Oral)   Resp 18   Wt 188 lb (85.3 kg)   SpO2 97% Comment: room air  BMI 26.98 kg/m  There were no vitals filed for this visit.   Physical Exam  General Appearance:    Alert, cooperative, no distress  HENT:   bilateral TM normal without fluid or infection, neck without nodes, sinuses nontender, post nasal drip noted and nasal mucosa pale and congested  Eyes:    PERRL, conjunctiva/corneas clear, EOM's intact       Lungs:     Clear to auscultation bilaterally, respirations unlabored  Heart:    Regular rate and rhythm  Neurologic:   Awake, alert, oriented x 3. No apparent focal neurological           defect.           Assessment & Plan:     1. Cough Patient Instructions   Start taking Zyrtec (cetirizine) 10mg  every day and bedtime   You can also take OTC Mucinex as needed for cough and chest congestion.    Call if any fever, difficulty breathing or worsening sympoms    2. Non-allergic vasomotor rhinitis  - montelukast (SINGULAIR) 10 MG tablet; Take 1 tablet (10 mg total) by mouth daily.  Dispense: 30 tablet;  Refill: 3  3. Thumb tendonitis He also complains pain at base of thumb worse at end of work day. Recommend apply ice at end of work day and take OTC ibuprofen prn. Can call for orthopedic referral if worsens or if affecting work.         Lelon Huh, MD  [facility]

## 2017-07-26 NOTE — Patient Instructions (Addendum)
   Start taking Zyrtec (cetirizine) 10mg  every day and bedtime   You can also take OTC Mucinex as needed for cough and chest congestion.    Call if any fever, difficulty breathing or worsening sympoms

## 2017-09-27 ENCOUNTER — Ambulatory Visit: Payer: Federal, State, Local not specified - PPO | Admitting: Family Medicine

## 2017-09-27 ENCOUNTER — Encounter: Payer: Self-pay | Admitting: Family Medicine

## 2017-09-27 VITALS — BP 124/76 | HR 72 | Temp 98.6°F | Resp 16 | Wt 187.0 lb

## 2017-09-27 DIAGNOSIS — J329 Chronic sinusitis, unspecified: Secondary | ICD-10-CM | POA: Diagnosis not present

## 2017-09-27 MED ORDER — AMOXICILLIN 500 MG PO CAPS: 1000.0000 mg | ORAL_CAPSULE | Freq: Three times a day (TID) | ORAL | 0 refills | Status: AC

## 2017-09-27 NOTE — Progress Notes (Signed)
Patient: Mark Hill. Male    DOB: 1960-11-06   56 y.o.   MRN: [id number] Visit Date: 09/27/2017  Today's Provider: Lelon Huh, MD   Chief Complaint  Patient presents with  . Sinusitis   Subjective:    Sinusitis  This is a new problem. Episode onset: x 1 week. The problem is unchanged. There has been no fever. Associated symptoms include congestion (nasal congestion; yellow and malodorous), coughing, headaches, sinus pressure, sneezing (some) and a sore throat (due to drainage). Pertinent negatives include no chills, diaphoresis, ear pain, hoarse voice, neck pain, shortness of breath or swollen glands. Treatments tried: Mucinex. The treatment provided no relief.       No Known Allergies   Current Outpatient Medications:  .  finasteride (PROSCAR) 5 MG tablet, Take 1 tablet by mouth daily., Disp: , Rfl:  .  ibuprofen (ADVIL,MOTRIN) 200 MG tablet, Take 400 mg by mouth every 6 (six) hours as needed for headache. , Disp: , Rfl:  .  montelukast (SINGULAIR) 10 MG tablet, Take 1 tablet (10 mg total) by mouth daily., Disp: 30 tablet, Rfl: 3 .  RAPAFLO 8 MG CAPS capsule, Take 1 capsule by mouth daily., Disp: , Rfl:  .  cetirizine (ZYRTEC) 10 MG tablet, Take 10 mg by mouth daily., Disp: , Rfl:   Review of Systems  Constitutional: Negative for chills and diaphoresis.  HENT: Positive for congestion (nasal congestion; yellow and malodorous), postnasal drip, sinus pressure, sneezing (some) and sore throat (due to drainage). Negative for ear pain and hoarse voice.   Respiratory: Positive for cough. Negative for shortness of breath.   Musculoskeletal: Negative for neck pain.  Neurological: Positive for headaches.    Social History   Tobacco Use  . Smoking status: Never Smoker  . Smokeless tobacco: Never Used  Substance Use Topics  . Alcohol use: Yes    Alcohol/week: 0.0 oz    Comment: ocassionally on weekends   Objective:   BP 124/76 (BP Location: Left Arm, Patient  Position: Sitting, Cuff Size: Normal)   Pulse 72   Temp 98.6 F (37 C) (Oral)   Resp 16   Wt 187 lb (84.8 kg)   SpO2 99%   BMI 26.83 kg/m  Vitals:   09/27/17 0858  BP: 124/76  Pulse: 72  Resp: 16  Temp: 98.6 F (37 C)  TempSrc: Oral  SpO2: 99%  Weight: 187 lb (84.8 kg)     Physical Exam  General Appearance:    Alert, cooperative, no distress  HENT:   bilateral TM normal without fluid or infection, neck without nodes, throat normal without erythema or exudate, frontal sinus tender and nasal mucosa pale and congested  Eyes:    PERRL, conjunctiva/corneas clear, EOM's intact       Lungs:     Clear to auscultation bilaterally, respirations unlabored  Heart:    Regular rate and rhythm  Neurologic:   Awake, alert, oriented x 3. No apparent focal neurological           defect.           Assessment & Plan:     1. Sinusitis, unspecified chronicity, unspecified location  - amoxicillin (AMOXIL) 500 MG capsule; Take 2 capsules (1,000 mg total) by mouth 3 (three) times daily for 5 days.  Dispense: 30 capsule; Refill: 0  Call if symptoms change or if not rapidly improving.        The entirety of the information documented in  the History of Present Illness, Review of Systems and Physical Exam were personally obtained by me. Portions of this information were initially documented by Renaldo Fiddler, CMA and reviewed by me for thoroughness and accuracy.    Lelon Huh, MD  [facility]

## 2017-12-15 ENCOUNTER — Other Ambulatory Visit: Payer: Self-pay | Admitting: Family Medicine

## 2017-12-15 DIAGNOSIS — M779 Enthesopathy, unspecified: Secondary | ICD-10-CM

## 2017-12-15 DIAGNOSIS — M778 Other enthesopathies, not elsewhere classified: Secondary | ICD-10-CM

## 2017-12-15 DIAGNOSIS — J300 Vasomotor rhinitis: Secondary | ICD-10-CM

## 2018-03-31 ENCOUNTER — Ambulatory Visit: Payer: Federal, State, Local not specified - PPO | Admitting: Family Medicine

## 2018-03-31 ENCOUNTER — Encounter: Payer: Self-pay | Admitting: Family Medicine

## 2018-03-31 VITALS — BP 132/78 | HR 78 | Temp 98.2°F | Resp 16 | Wt 187.6 lb

## 2018-03-31 DIAGNOSIS — N3090 Cystitis, unspecified without hematuria: Secondary | ICD-10-CM

## 2018-03-31 LAB — POCT URINALYSIS DIPSTICK
Bilirubin, UA: NEGATIVE
Glucose, UA: NEGATIVE
Ketones, UA: NEGATIVE
NITRITE UA: POSITIVE
PH UA: 6 (ref 5.0–8.0)
PROTEIN UA: POSITIVE — AB
Spec Grav, UA: 1.02 (ref 1.010–1.025)
UROBILINOGEN UA: 0.2 U/dL

## 2018-03-31 MED ORDER — SULFAMETHOXAZOLE-TRIMETHOPRIM 800-160 MG PO TABS: 1.0000 | ORAL_TABLET | Freq: Two times a day (BID) | ORAL | 1 refills | Status: DC

## 2018-03-31 NOTE — Progress Notes (Signed)
  Subjective:     Patient ID: Mark Hill., male   DOB: 08/10/1961, 57 y.o.   MRN: [id number] Chief Complaint  Patient presents with  . Urinary Tract Infection    Patient comes in office this morning with complaints of dysuria and odor for the past 3hrs.    HPI Last UTI in July of last year. Reports compliance on BPH medication. Has not seen his urologist, Dr. Eliberto Ivory, in over a year.  Review of Systems     Objective:   Physical Exam  Constitutional: He appears well-developed and well-nourished. No distress.  Genitourinary:  Genitourinary Comments: No cva tenderness       Assessment:    1. Cystitis - Urine Culture - POCT urinalysis dipstick - sulfamethoxazole-trimethoprim (BACTRIM DS,SEPTRA DS) 800-160 MG tablet; Take 1 tablet by mouth 2 (two) times daily.  Dispense: 14 tablet; Refill: 1    Plan:    Further f/u pending urine culture report.

## 2018-03-31 NOTE — Patient Instructions (Signed)
We will call you with the urine culture result. 

## 2018-04-01 ENCOUNTER — Ambulatory Visit: Payer: Federal, State, Local not specified - PPO | Admitting: Family Medicine

## 2018-04-01 ENCOUNTER — Encounter: Payer: Self-pay | Admitting: Family Medicine

## 2018-04-01 VITALS — BP 136/80 | HR 82 | Temp 98.2°F | Resp 16 | Wt 188.0 lb

## 2018-04-01 DIAGNOSIS — D229 Melanocytic nevi, unspecified: Secondary | ICD-10-CM | POA: Diagnosis not present

## 2018-04-01 NOTE — Patient Instructions (Signed)
Discussed using hydrocortisone cream for irritation. Do follow up with dermatology for further evaluation and treatment.

## 2018-04-01 NOTE — Progress Notes (Signed)
  Subjective:     Patient ID: Mark Hill., male   DOB: Feb 17, 1961, 57 y.o.   MRN: [id number] Chief Complaint  Patient presents with  . Skin Problem    Patient comes in office today to address concern of a possible mole found on the left side of his abdomen. Patient states that skin is raised, red and itchy, he has applied otc ointment to skin as a protectant. Patient does not recall scratching his skin or rubbing against anything that could cause irriation.    HPI   Review of Systems     Objective:   Physical Exam  Constitutional: He appears well-developed and well-nourished. No distress.  Skin:  Left side of abdomen with well circumscribed 0.5 cm grayish nevus with small inflammatory flare       Assessment:    1. Irritated nevus    Plan:    Discussed use of hydrocortisone cream He plans to get more definitive treatment from a dermatologist.

## 2018-04-02 LAB — URINE CULTURE

## 2018-04-04 ENCOUNTER — Telehealth: Payer: Self-pay

## 2018-04-04 NOTE — Telephone Encounter (Signed)
-----   Message from Carmon Ginsberg, [place] sent at 04/04/2018  7:25 AM EDT ----- Continue sulfa antibiotic for an E. Coli infection

## 2018-04-04 NOTE — Telephone Encounter (Signed)
Patient advised.KW 

## 2018-05-30 DIAGNOSIS — R350 Frequency of micturition: Secondary | ICD-10-CM | POA: Diagnosis not present

## 2018-05-30 DIAGNOSIS — N401 Enlarged prostate with lower urinary tract symptoms: Secondary | ICD-10-CM | POA: Diagnosis not present

## 2018-05-30 DIAGNOSIS — Z125 Encounter for screening for malignant neoplasm of prostate: Secondary | ICD-10-CM | POA: Diagnosis not present

## 2018-07-20 DIAGNOSIS — H40003 Preglaucoma, unspecified, bilateral: Secondary | ICD-10-CM | POA: Diagnosis not present

## 2018-10-03 DIAGNOSIS — H40003 Preglaucoma, unspecified, bilateral: Secondary | ICD-10-CM | POA: Diagnosis not present

## 2018-11-04 ENCOUNTER — Ambulatory Visit: Payer: Federal, State, Local not specified - PPO | Admitting: Family Medicine

## 2018-11-04 ENCOUNTER — Other Ambulatory Visit: Payer: Self-pay

## 2018-11-04 ENCOUNTER — Encounter: Payer: Self-pay | Admitting: Family Medicine

## 2018-11-04 VITALS — BP 138/80 | HR 68 | Temp 97.9°F | Ht 70.0 in | Wt 182.0 lb

## 2018-11-04 DIAGNOSIS — L720 Epidermal cyst: Secondary | ICD-10-CM | POA: Diagnosis not present

## 2018-11-04 NOTE — Patient Instructions (Signed)
You will get a call about your surgical appointment.

## 2018-11-04 NOTE — Progress Notes (Signed)
  Subjective:     Patient ID: Mark Hill., male   DOB: [birth year], 58 y.o.   MRN: [id number] Chief Complaint  Patient presents with  . Cyst    possible clogged hair folical since 4/61/90   HPI States he has asymptomatic lump in his left axilla. Has only noticed it recently.  Review of Systems     Objective:   Physical Exam Constitutional:      General: He is not in acute distress. Skin:    Comments: Left axilla with approx. 2 cm mobile mass without erythema/tenderness/drainage c/w epidermoid cyst.  Neurological:     Mental Status: He is alert.        Assessment:    1. Epidermoid cyst: patient wishes definitive removal  - Ambulatory referral to General Surgery    Plan:    Further f/u prn.

## 2018-11-21 ENCOUNTER — Ambulatory Visit: Payer: Federal, State, Local not specified - PPO | Admitting: Family Medicine

## 2018-11-21 ENCOUNTER — Encounter: Payer: Self-pay | Admitting: Family Medicine

## 2018-11-21 ENCOUNTER — Other Ambulatory Visit: Payer: Self-pay

## 2018-11-21 VITALS — BP 120/74 | HR 103 | Temp 99.1°F | Ht 70.0 in | Wt 182.2 lb

## 2018-11-21 DIAGNOSIS — J069 Acute upper respiratory infection, unspecified: Secondary | ICD-10-CM

## 2018-11-21 MED ORDER — AZITHROMYCIN 250 MG PO TABS: ORAL_TABLET | ORAL | 0 refills | Status: AC

## 2018-11-21 NOTE — Progress Notes (Signed)
       Patient: Mark Hill. Male    DOB: 11/08/1960   58 y.o.   MRN: [id number] Visit Date: 11/21/2018  Today's Provider: Lelon Huh, MD   Chief Complaint  Patient presents with  . Cough    yellow mucus, congestion, some fever, headaches, started last night 11/20/18, chills this morning   Subjective:    HPI He reports 1 day history of persistent cough productive yellow sputum, sinus drainage and congestion. Felt feverish today. No dyspnea. No sore throat. No sinus pain or pressure. Taken OTC Robitussin with minimal relief from cough.    No Known Allergies   Current Outpatient Medications:  .  cetirizine (ZYRTEC) 10 MG tablet, Take 10 mg by mouth daily., Disp: , Rfl:  .  finasteride (PROSCAR) 5 MG tablet, Take 1 tablet by mouth daily., Disp: , Rfl:  .  ibuprofen (ADVIL,MOTRIN) 200 MG tablet, Take 400 mg by mouth every 6 (six) hours as needed for headache. , Disp: , Rfl:  .  RAPAFLO 8 MG CAPS capsule, Take 1 capsule by mouth daily., Disp: , Rfl:   Review of Systems  Eyes: Negative.   Cardiovascular: Negative.   Endocrine: Negative.   Genitourinary: Negative.   Musculoskeletal: Negative.   Skin: Negative.   Allergic/Immunologic: Negative.   Hematological: Negative.   Psychiatric/Behavioral: Negative.     Social History   Tobacco Use  . Smoking status: Never Smoker  . Smokeless tobacco: Never Used  Substance Use Topics  . Alcohol use: Yes    Alcohol/week: 0.0 standard drinks    Comment: ocassionally on weekends      Objective:   BP 120/74 (BP Location: Left Arm, Patient Position: Sitting, Cuff Size: Normal)   Pulse (!) 103   Temp 99.1 F (37.3 C) (Oral)   Ht 5\' 10"  (1.778 m)   Wt 182 lb 3.2 oz (82.6 kg)   SpO2 99%   BMI 26.14 kg/m  Vitals:   11/21/18 1558  BP: 120/74  Pulse: (!) 103  Temp: 99.1 F (37.3 C)  TempSrc: Oral  SpO2: 99%  Weight: 182 lb 3.2 oz (82.6 kg)  Height: 5\' 10"  (1.778 m)    Physical Exam  General Appearance:     Alert, cooperative, no distress  HENT:   bilateral TM normal without fluid or infection, neck without nodes, throat normal without erythema or exudate, sinuses nontender, post nasal drip noted and nasal mucosa pale and congested  Eyes:    PERRL, conjunctiva/corneas clear, EOM's intact       Lungs:     Clear to auscultation bilaterally, respirations unlabored  Heart:    Regular rate and rhythm  Neurologic:   Awake, alert, oriented x 3. No apparent focal neurological           defect.          Assessment & Plan    1. Upper respiratory tract infection, unspecified type Counseled regarding prudent diet and regular exercise. Printed prescription for azithromycin to fill only if s/s bacterial infection develop.      Lelon Huh, MD  [facility]

## 2018-11-21 NOTE — Patient Instructions (Signed)
.   Please review the attached list of medications and notify my office if there are any errors.   . Please bring all of your medications to every appointment so we can make sure that our medication list is the same as yours.   

## 2018-11-23 ENCOUNTER — Telehealth: Payer: Self-pay | Admitting: Family Medicine

## 2018-11-23 NOTE — Telephone Encounter (Signed)
Is not my patient.  I think he saw Dr. Caryn Section and probably needs to be assigned to Dr. Caryn Section if he wants him.  May be assigned to Mikki Santee now but that will change in a couple of weeks anyway.  I do not think I have ever seen him.

## 2018-11-23 NOTE — Telephone Encounter (Signed)
Ok to write note? Please advise. Thanks!

## 2018-11-23 NOTE — Telephone Encounter (Signed)
Note printed and left up front for pick up. Patient advised.

## 2018-11-23 NOTE — Telephone Encounter (Signed)
[place] for work excuse Tuesday Wednesday and thursday

## 2018-11-23 NOTE — Telephone Encounter (Signed)
Pt needing his return to work note updated to:   Out of work sick   Tue. -  [place].   and return to work on Fri 21.  Please call him when ready for pickup.  Thanks, [employer]

## 2018-11-23 NOTE — Telephone Encounter (Signed)
Please review. Patient was seen by Dr. Caryn Section on 11/21/2018.

## 2018-11-24 ENCOUNTER — Ambulatory Visit: Payer: Federal, State, Local not specified - PPO | Admitting: General Surgery

## 2018-11-25 ENCOUNTER — Telehealth: Payer: Self-pay

## 2018-11-25 NOTE — Telephone Encounter (Signed)
Letter printed and placed up front. Patient advised.

## 2018-11-25 NOTE — Telephone Encounter (Signed)
Pt called and was asking if he could have a dr note for today.  He is feeling some better and just feels he needs another day to recover from his illness.  [phone] call back.  dbs

## 2018-11-25 NOTE — Telephone Encounter (Signed)
That's fine

## 2018-12-01 ENCOUNTER — Ambulatory Visit: Payer: Federal, State, Local not specified - PPO | Admitting: General Surgery

## 2019-03-20 ENCOUNTER — Encounter: Payer: Self-pay | Admitting: General Surgery

## 2019-05-29 DIAGNOSIS — R350 Frequency of micturition: Secondary | ICD-10-CM | POA: Diagnosis not present

## 2019-05-29 DIAGNOSIS — Z125 Encounter for screening for malignant neoplasm of prostate: Secondary | ICD-10-CM | POA: Diagnosis not present

## 2019-05-29 DIAGNOSIS — N401 Enlarged prostate with lower urinary tract symptoms: Secondary | ICD-10-CM | POA: Diagnosis not present

## 2020-01-22 ENCOUNTER — Other Ambulatory Visit: Payer: Self-pay

## 2020-01-22 ENCOUNTER — Encounter: Payer: Self-pay | Admitting: Physician Assistant

## 2020-01-22 ENCOUNTER — Ambulatory Visit: Payer: Federal, State, Local not specified - PPO | Admitting: Physician Assistant

## 2020-01-22 VITALS — BP 138/80 | HR 64 | Temp 97.0°F | Resp 16 | Wt 194.0 lb

## 2020-01-22 DIAGNOSIS — R3989 Other symptoms and signs involving the genitourinary system: Secondary | ICD-10-CM | POA: Diagnosis not present

## 2020-01-22 LAB — POCT URINALYSIS DIPSTICK
Bilirubin, UA: NEGATIVE
Glucose, UA: NEGATIVE
Ketones, UA: NEGATIVE
Nitrite, UA: POSITIVE
Protein, UA: NEGATIVE
Spec Grav, UA: 1.02
Urobilinogen, UA: 0.2 U/dL
pH, UA: 6.5

## 2020-01-22 MED ORDER — SULFAMETHOXAZOLE-TRIMETHOPRIM 800-160 MG PO TABS: 1.0000 | ORAL_TABLET | Freq: Two times a day (BID) | ORAL | 0 refills | Status: DC

## 2020-01-22 NOTE — Patient Instructions (Signed)

## 2020-01-22 NOTE — Progress Notes (Signed)
Established patient visit    Patient: Mark Hill.   DOB: 01/12/1961   59 y.o. Male  MRN: [id number] Visit Date: 01/22/2020  Today's healthcare provider: Mar Daring, PA-C   Chief Complaint  Patient presents with  . Possible UTI   Subjective    Urinary Tract Infection  This is a new problem. The current episode started yesterday. The problem occurs intermittently. The problem has been unchanged. The quality of the pain is described as burning. The patient is experiencing no pain. There has been no fever. He is sexually active. Pertinent negatives include no flank pain, frequency, hematuria or urgency. He has tried NSAIDs for the symptoms. hx of recurrent UTI   Patient does have h/o cystitis and prostatitis, last infection was 2019. Does have BPH, followed by Urology, Dr. Eliberto Ivory.  Second Covid shot 2 weeks ago.      Medications: Outpatient Medications Prior to Visit  Medication Sig  . cetirizine (ZYRTEC) 10 MG tablet Take 10 mg by mouth daily.  . finasteride (PROSCAR) 5 MG tablet Take 1 tablet by mouth daily.  Marland Kitchen ibuprofen (ADVIL,MOTRIN) 200 MG tablet Take 400 mg by mouth every 6 (six) hours as needed for headache.   . Probiotic Product (PROBIOTIC DAILY PO) Take by mouth.  Marland Kitchen RAPAFLO 8 MG CAPS capsule Take 1 capsule by mouth daily.   No facility-administered medications prior to visit.    Review of Systems  Constitutional: Negative.   Respiratory: Negative.   Cardiovascular: Negative.   Gastrointestinal: Negative for abdominal pain.  Genitourinary: Positive for dysuria. Negative for decreased urine volume, discharge, flank pain, frequency, hematuria, penile pain, penile swelling, scrotal swelling, testicular pain and urgency.        Objective    BP 138/80 (BP Location: Left Arm, Patient Position: Sitting, Cuff Size: Large)   Pulse 64   Temp (!) 97 F (36.1 C) (Temporal)   Resp 16   Wt 194 lb (88 kg)   BMI 27.84 kg/m  BP Readings from Last 3  Encounters:  01/22/20 138/80  11/21/18 120/74  11/04/18 138/80   Wt Readings from Last 3 Encounters:  01/22/20 194 lb (88 kg)  11/21/18 182 lb 3.2 oz (82.6 kg)  11/04/18 182 lb (82.6 kg)      Physical Exam Vitals reviewed.  Constitutional:      General: He is not in acute distress.    Appearance: Normal appearance. He is well-developed and normal weight. He is not ill-appearing or diaphoretic.  Cardiovascular:     Rate and Rhythm: Normal rate and regular rhythm.     Heart sounds: Normal heart sounds. No murmur. No friction rub. No gallop.   Pulmonary:     Effort: Pulmonary effort is normal. No respiratory distress.     Breath sounds: Normal breath sounds. No wheezing or rales.  Abdominal:     General: Bowel sounds are normal. There is no distension.     Palpations: Abdomen is soft. There is no mass.     Tenderness: There is no abdominal tenderness. There is no right CVA tenderness, left CVA tenderness, guarding or rebound.  Skin:    General: Skin is warm and dry.  Neurological:     Mental Status: He is alert and oriented to person, place, and time.       Results for orders placed or performed in visit on 01/22/20  POCT urinalysis dipstick  Result Value Ref Range   Color, UA light yellow    Clarity,  UA clear    Glucose, UA Negative Negative   Bilirubin, UA Negative    Ketones, UA Negative    Spec Grav, UA 1.020 1.010 - 1.025   Blood, UA Trace    pH, UA 6.5 5.0 - 8.0   Protein, UA Negative Negative   Urobilinogen, UA 0.2 0.2 or 1.0 E.U./dL   Nitrite, UA Positive    Leukocytes, UA Moderate (2+) (A) Negative   Appearance     Odor       Assessment & Plan     1. Possible urinary tract infection Worsening symptoms. UA positive. Will treat empirically with Bactrim as below. Continue to push fluids. Urine sent for culture. Will follow up pending C&S results. She is to call if symptoms do not improve or if they worsen.  - POCT urinalysis dipstick -  sulfamethoxazole-trimethoprim (BACTRIM DS) 800-160 MG tablet; Take 1 tablet by mouth 2 (two) times daily.  Dispense: 14 tablet; Refill: 0 - Urine Culture   No follow-ups on file.      Reynolds Bowl, PA-C, have reviewed all documentation for this visit. The documentation on 01/22/20 for the exam, diagnosis, procedures, and orders are all accurate and complete.   Rubye Beach  [facility] [phone] (phone) [phone] (fax)  [place]

## 2020-01-25 LAB — URINE CULTURE

## 2020-08-02 DIAGNOSIS — Z79899 Other long term (current) drug therapy: Secondary | ICD-10-CM | POA: Diagnosis not present

## 2020-08-02 DIAGNOSIS — N401 Enlarged prostate with lower urinary tract symptoms: Secondary | ICD-10-CM | POA: Diagnosis not present

## 2020-08-02 DIAGNOSIS — Z125 Encounter for screening for malignant neoplasm of prostate: Secondary | ICD-10-CM | POA: Diagnosis not present

## 2020-08-09 ENCOUNTER — Ambulatory Visit (INDEPENDENT_AMBULATORY_CARE_PROVIDER_SITE_OTHER): Payer: Federal, State, Local not specified - PPO | Admitting: Physician Assistant

## 2020-08-09 ENCOUNTER — Encounter: Payer: Self-pay | Admitting: Physician Assistant

## 2020-08-09 ENCOUNTER — Other Ambulatory Visit: Payer: Self-pay

## 2020-08-09 VITALS — BP 147/79 | HR 80 | Temp 97.9°F | Wt 195.4 lb

## 2020-08-09 DIAGNOSIS — L723 Sebaceous cyst: Secondary | ICD-10-CM | POA: Diagnosis not present

## 2020-08-09 MED ORDER — DOXYCYCLINE HYCLATE 100 MG PO TABS: 100.0000 mg | ORAL_TABLET | Freq: Two times a day (BID) | ORAL | 0 refills | Status: AC

## 2020-08-09 NOTE — Progress Notes (Signed)
     Established patient visit   Patient: Mark Hill.   DOB: 09-30-1961   59 y.o. Male  MRN: [id number] Visit Date: 08/09/2020  Today's healthcare provider: Trinna Post, PA-C   Chief Complaint  Patient presents with  . Recurrent Skin Infections   Subjective    HPI  Patient presents today for a knob under left armpit for about one month now. Patient reports it is red with white pimple and painful. He reports warm compresses have helped slightly but the problem persists. Works in [employer] and this boil causes pain. He previously had a sebaceous cyst that was removed by Dr. Bary Castilla several years ago. He denies fevers, chills, drainage from the area. He did put a band aid on it earlier.      Medications: Outpatient Medications Prior to Visit  Medication Sig  . cetirizine (ZYRTEC) 10 MG tablet Take 10 mg by mouth daily.  . finasteride (PROSCAR) 5 MG tablet Take 1 tablet by mouth daily.  Marland Kitchen ibuprofen (ADVIL,MOTRIN) 200 MG tablet Take 400 mg by mouth every 6 (six) hours as needed for headache.   . Probiotic Product (PROBIOTIC DAILY PO) Take by mouth.  Marland Kitchen RAPAFLO 8 MG CAPS capsule Take 1 capsule by mouth daily.  . [DISCONTINUED] sulfamethoxazole-trimethoprim (BACTRIM DS) 800-160 MG tablet Take 1 tablet by mouth 2 (two) times daily.   No facility-administered medications prior to visit.    Review of Systems  Constitutional: Negative.   Respiratory: Negative.   Cardiovascular: Negative.   Hematological: Negative.       Objective    BP (!) 147/79 (BP Location: Right Arm, Patient Position: Sitting, Cuff Size: Large)   Pulse 80   Temp 97.9 F (36.6 C) (Oral)   Wt 195 lb 6.4 oz (88.6 kg)   SpO2 99%   BMI 28.04 kg/m    Physical Exam Constitutional:      Appearance: Normal appearance. He is normal weight.  Skin:    General: Skin is warm and dry.       Neurological:     General: No focal deficit present.     Mental Status: He is alert and oriented to  person, place, and time.  Psychiatric:        Mood and Affect: Mood normal.        Behavior: Behavior normal.       No results found for any visits on 08/09/20.  Assessment & Plan    1. Sebaceous cyst of axilla  - doxycycline (VIBRA-TABS) 100 MG tablet; Take 1 tablet (100 mg total) by mouth 2 (two) times daily for 7 days.  Dispense: 14 tablet; Refill: 0 - Ambulatory referral to General Surgery   Return if symptoms worsen or fail to improve.      ITrinna Post, PA-C, have reviewed all documentation for this visit. The documentation on 08/09/20 for the exam, diagnosis, procedures, and orders are all accurate and complete.  The entirety of the information documented in the History of Present Illness, Review of Systems and Physical Exam were personally obtained by me. Portions of this information were initially documented by [facility] and reviewed by me for thoroughness and accuracy.     Paulene Floor  [facility] [phone] (phone) [phone] (fax)  [place]

## 2020-08-09 NOTE — Patient Instructions (Signed)
Epidermal Cyst  An epidermal cyst is a small, painless lump under your skin. The cyst contains a grayish-white, bad-smelling substance (keratin). Do not try to pop or open an epidermal cyst yourself. What are the causes?  A blocked hair follicle.  A hair that curls and re-enters the skin instead of growing straight out of the skin.  A blocked pore.  Irritated skin.  An injury to the skin.  Certain conditions that are passed along from parent to child (inherited).  Human papillomavirus (HPV).  Long-term sun damage to the skin. What increases the risk?  Having acne.  Being overweight.  Being 57-28 years old. What are the signs or symptoms? These cysts are usually harmless, but they can get infected. Symptoms of infection may include:  Redness.  Inflammation.  Tenderness.  Warmth.  Fever.  A grayish-white, bad-smelling substance drains from the cyst.  Pus drains from the cyst. How is this treated? In many cases, epidermal cysts go away on their own without treatment. If a cyst becomes infected, treatment may include:  Opening and draining the cyst, done by a doctor. After draining, you may need minor surgery to remove the rest of the cyst.  Antibiotic medicine.  Shots of medicines (steroids) that help to reduce inflammation.  Surgery to remove the cyst. Surgery may be done if the cyst: ? Becomes large. ? Bothers you. ? Has a chance of turning into cancer.  Do not try to open a cyst yourself. Follow these instructions at home:  Take over-the-counter and prescription medicines only as told by your doctor.  If you were prescribed an antibiotic medicine, take it it as told by your doctor. Do not stop using the antibiotic even if you start to feel better.  Keep the area around your cyst clean and dry.  Wear loose, dry clothing.  Avoid touching your cyst.  Check your cyst every day for signs of infection. Check for: ? Redness, swelling, or pain. ? Fluid  or blood. ? Warmth. ? Pus or a bad smell.  Keep all follow-up visits as told by your doctor. This is important. How is this prevented?  Wear clean, dry, clothing.  Avoid wearing tight clothing.  Keep your skin clean and dry. Take showers or baths every day. Contact a doctor if:  Your cyst has symptoms of infection.  Your condition does not improve or gets worse.  You have a cyst that looks different from other cysts you have had.  You have a fever. Get help right away if:  Redness spreads from the cyst into the area close by. Summary  An epidermal cyst is a sac made of skin tissue.  If a cyst becomes infected, treatment may include surgery to open and drain the cyst, or to remove it.  Take over-the-counter and prescription medicines only as told by your doctor.  Contact a doctor if your condition is not improving or is getting worse.  Keep all follow-up visits as told by your doctor. This is important. This information is not intended to replace advice given to you by your health care provider. Make sure you discuss any questions you have with your health care provider. Document Revised: 01/12/2019 Document Reviewed: 06/30/2018 Elsevier Patient Education  2020 [employer].

## 2020-08-12 DIAGNOSIS — H40003 Preglaucoma, unspecified, bilateral: Secondary | ICD-10-CM | POA: Diagnosis not present

## 2020-08-15 ENCOUNTER — Other Ambulatory Visit: Payer: Self-pay

## 2020-08-15 ENCOUNTER — Ambulatory Visit (INDEPENDENT_AMBULATORY_CARE_PROVIDER_SITE_OTHER): Payer: Federal, State, Local not specified - PPO | Admitting: Surgery

## 2020-08-15 ENCOUNTER — Encounter: Payer: Self-pay | Admitting: Surgery

## 2020-08-15 VITALS — Temp 98.5°F | Ht 69.0 in | Wt 191.6 lb

## 2020-08-15 DIAGNOSIS — L723 Sebaceous cyst: Secondary | ICD-10-CM

## 2020-08-15 NOTE — Patient Instructions (Signed)
Today we have drained your Cyst in the office. The numbing medication will wear off in approximately 4-8 hours. You will have some pain to the area afterwards but should not be as severe as prior to the procedure.  You may take 600mg  of Ibuprofen every 6-8 hours as needed for pain relief.  Finish all of your antibiotics.   You may remove your dressing tomorrow and shower. Let the warm soapy water run over the area. Rinse well and pat dry and reapply a new dressing. If your dressing seems to stick get it wet first.  Change your dressing at least once a day, more if it becomes saturated.  We will see you back in 1 week.  If you have any questions or concerns prior to your appointment, please call our office and speak with a nurse.  Incision and Drainage Incision and drainage is a surgical procedure to open and drain a fluid-filled sac. The sac may be filled with pus, mucus, or blood. Examples of fluid-filled sacs that may need surgical drainage include cysts, skin infections (abscesses), and red lumps that develop from a ruptured cyst or a small abscess (boils). You may need this procedure if the affected area is large, painful, infected, or not healing well. Tell a health care provider about:  Any allergies you have.  All medicines you are taking, including vitamins, herbs, eye drops, creams, and over-the-counter medicines.  Any problems you or family members have had with anesthetic medicines.  Any blood disorders you have.  Any surgeries you have had.  Any medical conditions you have.  Whether you are pregnant or may be pregnant. What are the risks? Generally, this is a safe procedure. However, problems may occur, including:  Infection.  Bleeding.  Allergic reactions to medicines.  Scarring.  What happens before the procedure?  You may need an ultrasound or other imaging tests to see how large or deep the fluid-filled sac is.  You may have blood tests to check for  infection.  You may get a tetanus shot.  You may be given antibiotic medicine to help prevent infection.  Follow instructions from your health care provider about eating or drinking restrictions.  Ask your health care provider about: ? Changing or stopping your regular medicines. This is especially important if you are taking diabetes medicines or blood thinners. ? Taking medicines such as aspirin and ibuprofen. These medicines can thin your blood. Do not take these medicines before your procedure if your health care provider instructs you not to.  Plan to have someone take you home after the procedure.  If you will be going home right after the procedure, plan to have someone stay with you for 24 hours. What happens during the procedure?  To reduce your risk of infection: ? Your health care team will wash or sanitize their hands. ? Your skin will be washed with soap.  You will be given one or more of the following: ? A medicine to help you relax (sedative). ? A medicine to numb the area (local anesthetic). ? A medicine to make you fall asleep (general anesthetic).  An incision will be made in the top of the fluid-filled sac.  The contents of the sac may be squeezed out, or a syringe or tube (catheter)may be used to empty the sac.  The catheter may be left in place for several weeks to drain any fluid. Or, your health care provider may stitch open the edges of the incision to make a  long-term opening for drainage (marsupialization).  The inside of the sac may be washed out (irrigated) with a sterile solution and packed with gauze before it is covered with a bandage (dressing). The procedure may vary among health care providers and hospitals. What happens after the procedure?  Your blood pressure, heart rate, breathing rate, and blood oxygen level will be monitored often until the medicines you were given have worn off.  Do not drive for 24 hours if you received a  sedative. This information is not intended to replace advice given to you by your health care provider. Make sure you discuss any questions you have with your health care provider. Document Released: 03/17/2001 Document Revised: 02/27/2016 Document Reviewed: 07/12/2015 Elsevier Interactive Patient Education  2017 [place].   Incision and Drainage, Care After Refer to this sheet in the next few weeks. These instructions provide you with information about caring for yourself after your procedure. Your health care provider may also give you more specific instructions. Your treatment has been planned according to current medical practices, but problems sometimes occur. Call your health care provider if you have any problems or questions after your procedure. What can I expect after the procedure? After the procedure, it is common to have:  Pain or discomfort around your incision site.  Drainage from your incision.  Follow these instructions at home:  Take over-the-counter and prescription medicines only as told by your health care provider.  If you were prescribed an antibiotic medicine, take it as told by your health care provider.Do not stop taking the antibiotic even if you start to feel better.  Followinstructions from your health care provider about: ? How to take care of your incision. ? When and how you should change your packing and bandage (dressing). Wash your hands with soap and water before you change your dressing. If soap and water are not available, use hand sanitizer. ? When you should remove your dressing.  Do not take baths, swim, or use a hot tub until your health care provider approves.  Keep all follow-up visits as told by your health care provider. This is important.  Check your incision area every day for signs of infection. Check for: ? More redness, swelling, or pain. ? More fluid or blood. ? Warmth. ? Pus or a bad smell. Contact a health care provider  if:  Your cyst or abscess returns.  You have a fever.  You have more redness, swelling, or pain around your incision.  You have more fluid or blood coming from your incision.  Your incision feels warm to the touch.  You have pus or a bad smell coming from your incision. Get help right away if:  You have severe pain or bleeding.  You cannot eat or drink without vomiting.  You have decreased urine output.  You become short of breath.  You have chest pain.  You cough up blood.  The area where the incision and drainage occurred becomes numb or it tingles. This information is not intended to replace advice given to you by your health care provider. Make sure you discuss any questions you have with your health care provider. Document Released: 12/14/2011 Document Revised: 02/21/2016 Document Reviewed: 07/12/2015 Elsevier Interactive Patient Education  2017 [employer].

## 2020-08-15 NOTE — Progress Notes (Signed)
Patient ID: Mark Catholic., male   DOB: 26-Mar-1961, 59 y.o.   MRN: [id number]  Chief Complaint: Infected left axillary cyst  History of Present Illness Mark Franek. is a 59 y.o. male with 2-week history of increased pain and swelling of a left axillary/cyst. Known to have cyst for multiple years. Currently on 6 days of antibiotics, utilizing warm compresses. Has some spontaneous discharge. Slight diminishment in pain tenderness.  Past Medical History Past Medical History:  Diagnosis Date  . Allergy    seasonal      Past Surgical History:  Procedure Laterality Date  . COLONOSCOPY N/A 03/29/2015   Procedure: COLONOSCOPY;  Surgeon: Lollie Sails, MD;  Location: [facility] ENDOSCOPY;  Service: Endoscopy;  Laterality: N/A;  . SHOULDER SURGERY Right 1981  . SKIN LESION EXCISION  05-16-2010   back cyst  . TONSILLECTOMY      No Known Allergies  Current Outpatient Medications  Medication Sig Dispense Refill  . cetirizine (ZYRTEC) 10 MG tablet Take 10 mg by mouth daily.    Marland Kitchen doxycycline (VIBRA-TABS) 100 MG tablet Take 1 tablet (100 mg total) by mouth 2 (two) times daily for 7 days. 14 tablet 0  . finasteride (PROSCAR) 5 MG tablet Take 1 tablet by mouth daily.    Marland Kitchen ibuprofen (ADVIL,MOTRIN) 200 MG tablet Take 400 mg by mouth every 6 (six) hours as needed for headache.     . latanoprost (XALATAN) 0.005 % ophthalmic solution SMARTSIG:In Eye(s)    . Probiotic Product (PROBIOTIC DAILY PO) Take by mouth.    Marland Kitchen RAPAFLO 8 MG CAPS capsule Take 1 capsule by mouth daily.     No current facility-administered medications for this visit.    Family History Family History  Problem Relation Age of Onset  . Diabetes Brother   . Congestive Heart Failure Father   . Heart attack Father        occured in his 46's  . Heart disease Father       Social History Social History   Tobacco Use  . Smoking status: Never Smoker  . Smokeless tobacco: Never Used  Substance Use Topics  . Alcohol  use: Yes    Alcohol/week: 0.0 standard drinks    Comment: ocassionally on weekends  . Drug use: No        Review of Systems  Constitutional: Negative for chills and fever.  HENT: Negative.   Eyes: Negative.   Respiratory: Negative.   Cardiovascular: Negative.   Gastrointestinal: Negative.   Genitourinary: Negative.   Skin: Negative.   Neurological: Negative.   Psychiatric/Behavioral: Negative.       Physical Exam Temperature 98.5 F (36.9 C), height 5\' 9"  (1.753 m), weight 191 lb 9.6 oz (86.9 kg). Last Weight  Most recent update: 08/15/2020 10:52 AM   Weight  86.9 kg (191 lb 9.6 oz)            CONSTITUTIONAL: Well developed, and nourished, appropriately responsive and aware without distress.   EYES: Sclera non-icteric.   EARS, NOSE, MOUTH AND THROAT: Mask worn.    Hearing is intact to voice.  NECK: Trachea is midline, and there is no jugular venous distension.  LYMPH NODES:  Lymph nodes in the neck are not enlarged. RESPIRATORY:  Lungs are clear, and breath sounds are equal bilaterally. Normal respiratory effort without pathologic use of accessory muscles. CARDIOVASCULAR: Heart is regular in rate and rhythm. GI: The abdomen is soft, nontender, and nondistended. There were no palpable masses. I did  not appreciate hepatosplenomegaly. There were normal bowel sounds.  MUSCULOSKELETAL:  Symmetrical muscle tone appreciated in all four extremities.    SKIN: The left axilla: There is a raised 5 cm fluctuant indurated mass, with an opening with minimal drainage but with visible sebaceous content. No other similar lesions appreciated. NEUROLOGIC:  Motor and sensation appear grossly normal.  Cranial nerves are grossly without defect. PSYCH:  Alert and oriented to person, place and time. Affect is appropriate for situation.  Data Reviewed I have personally reviewed what is currently available of the patient's imaging, recent labs and medical records.   Labs:  No flowsheet data  found. No flowsheet data found.    Imaging:  Within last 24 hrs: No results found.  Assessment    Abscess/markedly inflamed left axillary cyst, 5 cm x 3 cm x 3 cm. Patient Active Problem List   Diagnosis Date Noted  . Recurrent cystitis 07/30/2015  . Neck abscess 02/20/2014    Plan    Incision and drainage/excisional debridement of left axillary dermal cyst.  After informed consent was obtained. The left axilla was prepped in the usual sterile manner. Local infiltration with 1% lidocaine with epinephrine is done, elliptical incision was made excising the involved dermis and the overlying groove of the cavity. Multiple swabs were done with gauze to minimize the amount of seborrheic debris. Fragments of cyst cavity were identified and sequentially excised. Irrigation is completed ensuring all debris is removed. Hemostasis obtained with pressure. Wound was then left open along the medial aspect of the left axilla. Dry dressing applied. He tolerated the procedure well.  Wound care instructions given. He may shower. Offered follow-up in 1 week but he would like to defer till Wednesday Thanksgiving week. He will complete his course of antibiotics and utilize ibuprofen for pain control.   Face-to-face time spent with the patient and accompanying care providers(if present) was 35 minutes, with more than 50% of the time spent counseling, educating, and coordinating care of the patient.      Ronny Bacon M.D., FACS 08/15/2020, 11:30 AM

## 2020-08-20 ENCOUNTER — Ambulatory Visit: Payer: Federal, State, Local not specified - PPO | Admitting: Surgery

## 2020-08-28 ENCOUNTER — Other Ambulatory Visit: Payer: Self-pay

## 2020-08-28 ENCOUNTER — Encounter: Payer: Self-pay | Admitting: Physician Assistant

## 2020-08-28 ENCOUNTER — Ambulatory Visit: Payer: Federal, State, Local not specified - PPO | Admitting: Physician Assistant

## 2020-08-28 VITALS — BP 146/86 | HR 69 | Temp 98.7°F | Ht 69.5 in | Wt 194.6 lb

## 2020-08-28 DIAGNOSIS — L723 Sebaceous cyst: Secondary | ICD-10-CM

## 2020-08-28 DIAGNOSIS — Z09 Encounter for follow-up examination after completed treatment for conditions other than malignant neoplasm: Secondary | ICD-10-CM

## 2020-08-28 NOTE — Progress Notes (Signed)
[facility] POST-OP OFFICE VISIT  08/28/2020  HPI: Mark Wherry. is a 59 y.o. male 13 days s/p I&D of left axillary dermal cyst with Dr Christian Mate, MD.   He is overall doing well About 50% of his incision had superficially dehisced, and he has been doing superficial dressings for this.  Drainage has been minimal and serous No erythema, fever, chills No other complaints.   Vital signs: BP (!) 146/86   Pulse 69   Temp 98.7 F (37.1 C) (Oral)   Ht 5' 9.5" (1.765 m)   Wt 194 lb 9.6 oz (88.3 kg)   SpO2 98%   BMI 28.33 kg/m    Physical Exam: Constitutional: Well appearing male, NAD Skin: Incision to the left axil, this is about 50% closed, the remaining 50% is healing via secondary intention with hypertrophic granulation tissue present, no drainage or erythema  Assessment/Plan: This is a 59 y.o. male 13 days s/p I&D of left axillary dermal cyst   - I did apply silver nitrate to granulation tissue to aid in drainage control  - Applied non-adherent dressing; reviewed wound care  - No evidence of infection to warrant ABx  - He will rtc prn  -- Edison Simon, PA-C [facility] 08/28/2020, 10:13 AM [phone] M-F: 7am - 4pm

## 2020-08-28 NOTE — Patient Instructions (Addendum)
Follow-up with our office as needed.Please call and ask to speak with a nurse if you develop questions or concerns.  GENERAL POST-OPERATIVE PATIENT INSTRUCTIONS   WOUND CARE INSTRUCTIONS:  Keep a dry clean dressing on the wound if there is drainage. The initial bandage may be removed after 24 hours.  Once the wound has quit draining you may leave it open to air.  If clothing rubs against the wound or causes irritation and the wound is not draining you may cover it with a dry dressing during the daytime.  Try to keep the wound dry and avoid ointments on the wound unless directed to do so.  If the wound becomes bright red and painful or starts to drain infected material that is not clear, please contact your physician immediately.  If the wound is mildly pink and has a thick firm ridge underneath it, this is normal, and is referred to as a healing ridge.  This will resolve over the next 4-6 weeks.  BATHING: You may shower if you have been informed of this by your surgeon. However, Please do not submerge in a tub, hot tub, or pool until incisions are completely sealed or have been told by your surgeon that you may do so.  DIET:  You may eat any foods that you can tolerate.  It is a good idea to eat a high fiber diet and take in plenty of fluids to prevent constipation.  If you do become constipated you may want to take a mild laxative or take ducolax tablets on a daily basis until your bowel habits are regular.  Constipation can be very uncomfortable, along with straining, after recent surgery.  ACTIVITY:  You are encouraged to cough and deep breath or use your incentive spirometer if you were given one, every 15-30 minutes when awake.  This will help prevent respiratory complications and low grade fevers post-operatively if you had a general anesthetic.  You may want to hug a pillow when coughing and sneezing to add additional support to the surgical area, if you had abdominal or chest surgery, which will  decrease pain during these times.  You are encouraged to walk and engage in light activity for the next two weeks.  You should not lift more than 20 pounds, until 4 to 6 weeks after surgery as it could put you at increased risk for complications.  Twenty pounds is roughly equivalent to a plastic bag of groceries. At that time- Listen to your body when lifting, if you have pain when lifting, stop and then try again in a few days. Soreness after doing exercises or activities of daily living is normal as you get back in to your normal routine.  MEDICATIONS:  Try to take narcotic medications and anti-inflammatory medications, such as tylenol, ibuprofen, naprosyn, etc., with food.  This will minimize stomach upset from the medication.  Should you develop nausea and vomiting from the pain medication, or develop a rash, please discontinue the medication and contact your physician.  You should not drive, make important decisions, or operate machinery when taking narcotic pain medication.  SUNBLOCK Use sun block to incision area over the next year if this area will be exposed to sun. This helps decrease scarring and will allow you avoid a permanent darkened area over your incision.  QUESTIONS:  Please feel free to call our office if you have any questions, and we will be glad to assist you. [phone].    

## 2020-10-15 ENCOUNTER — Encounter: Payer: Self-pay | Admitting: Physician Assistant

## 2020-10-15 ENCOUNTER — Ambulatory Visit: Payer: Federal, State, Local not specified - PPO | Admitting: Physician Assistant

## 2020-10-15 ENCOUNTER — Other Ambulatory Visit: Payer: Self-pay

## 2020-10-15 VITALS — BP 139/86 | HR 78 | Temp 98.3°F | Wt 197.4 lb

## 2020-10-15 DIAGNOSIS — N3090 Cystitis, unspecified without hematuria: Secondary | ICD-10-CM

## 2020-10-15 LAB — POCT URINALYSIS DIPSTICK
Bilirubin, UA: NEGATIVE
Blood, UA: NEGATIVE
Glucose, UA: NEGATIVE
Ketones, UA: NEGATIVE
Nitrite, UA: NEGATIVE
Protein, UA: NEGATIVE
Spec Grav, UA: 1.025 (ref 1.010–1.025)
Urobilinogen, UA: 0.2 E.U./dL
pH, UA: 6 (ref 5.0–8.0)

## 2020-10-15 MED ORDER — SULFAMETHOXAZOLE-TRIMETHOPRIM 800-160 MG PO TABS: 1.0000 | ORAL_TABLET | Freq: Two times a day (BID) | ORAL | 0 refills | Status: AC

## 2020-10-15 NOTE — Progress Notes (Signed)
Established patient visit   Patient: Mark Hill.   DOB: 06-24-1961   60 y.o. Male  MRN: [id number] Visit Date: 10/15/2020  Today's healthcare provider: Trinna Post, PA-C   Chief Complaint  Patient presents with  . Dysuria  I,Elianny Buxbaum M Lenford Beddow,acting as a scribe for Trinna Post, PA-C.,have documented all relevant documentation on the behalf of Trinna Post, PA-C,as directed by  Trinna Post, PA-C while in the presence of Trinna Post, PA-C.  Subjective    Dysuria  This is a new problem. The current episode started in the past 7 days. The problem occurs every urination. The problem has been unchanged. The quality of the pain is described as burning. The pain is at a severity of 4/10. The pain is mild. There has been no fever. Associated symptoms include hesitancy and urgency. Pertinent negatives include no discharge, frequency, nausea or vomiting. He has tried increased fluids and NSAIDs for the symptoms. The treatment provided no relief. His past medical history is significant for recurrent UTIs.    Patient with history of recurrent cystitis presenting today with some dysuria. He is seen by Dr. Eliberto Ivory in urology.     Medications: Outpatient Medications Prior to Visit  Medication Sig  . cetirizine (ZYRTEC) 10 MG tablet Take 10 mg by mouth daily.  . finasteride (PROSCAR) 5 MG tablet Take 1 tablet by mouth daily.  Marland Kitchen ibuprofen (ADVIL,MOTRIN) 200 MG tablet Take 400 mg by mouth every 6 (six) hours as needed for headache.   . latanoprost (XALATAN) 0.005 % ophthalmic solution SMARTSIG:In Eye(s)  . Probiotic Product (PROBIOTIC DAILY PO) Take by mouth.  Marland Kitchen RAPAFLO 8 MG CAPS capsule Take 1 capsule by mouth daily.   No facility-administered medications prior to visit.    Review of Systems  Gastrointestinal: Negative for abdominal pain, nausea and vomiting.  Genitourinary: Positive for dysuria, hesitancy and urgency. Negative for frequency.  Musculoskeletal:  Negative for back pain.    {Labs  Heme  Chem  Endocrine  Serology  Results Review (optional):23779::" "}  Objective    BP 139/86 (BP Location: Left Arm, Patient Position: Sitting, Cuff Size: Large)   Pulse 78   Temp 98.3 F (36.8 C) (Oral)   Wt 197 lb 6.4 oz (89.5 kg)   SpO2 99%   BMI 28.73 kg/m    Physical Exam Constitutional:      Appearance: Normal appearance.  Cardiovascular:     Rate and Rhythm: Normal rate and regular rhythm.     Heart sounds: Normal heart sounds.  Pulmonary:     Effort: Pulmonary effort is normal.     Breath sounds: Normal breath sounds.  Abdominal:     General: Abdomen is flat.     Palpations: Abdomen is soft.     Tenderness: There is no abdominal tenderness.  Skin:    General: Skin is warm and dry.  Neurological:     General: No focal deficit present.     Mental Status: He is alert and oriented to person, place, and time.  Psychiatric:        Mood and Affect: Mood normal.        Behavior: Behavior normal.       No results found for any visits on 10/15/20.  Assessment & Plan    1. Recurrent cystitis  - sulfamethoxazole-trimethoprim (BACTRIM DS) 800-160 MG tablet; Take 1 tablet by mouth 2 (two) times daily for 7 days.  Dispense: 14 tablet; Refill:  0   Return if symptoms worsen or fail to improve.      ITrinna Post, PA-C, have reviewed all documentation for this visit. The documentation on 10/15/20 for the exam, diagnosis, procedures, and orders are all accurate and complete.  The entirety of the information documented in the History of Present Illness, Review of Systems and Physical Exam were personally obtained by me. Portions of this information were initially documented by [facility] and reviewed by me for thoroughness and accuracy.     Paulene Floor  [facility] [phone] (phone) [phone] (fax)  [place]

## 2020-10-15 NOTE — Addendum Note (Signed)
Addended by: Casimer Leek C on: 10/15/2020 03:16 PM   Modules accepted: Orders

## 2020-10-17 ENCOUNTER — Ambulatory Visit: Payer: Self-pay | Admitting: *Deleted

## 2020-10-17 LAB — URINE CULTURE: Organism ID, Bacteria: NO GROWTH

## 2020-10-17 NOTE — Telephone Encounter (Signed)
Patient calling to receive urine test results. No note from provider noted at this time. Patient verbalized understanding provider would send message on UA results and he would be notified. Patient awaiting result noted from PCP.

## 2020-10-18 NOTE — Telephone Encounter (Signed)
Please review and advise. Patient last seen by Adriana on 10/15/2020.

## 2020-10-22 ENCOUNTER — Telehealth: Payer: Self-pay

## 2020-10-22 NOTE — Telephone Encounter (Signed)
Called patient and LVMTRC. °

## 2020-10-22 NOTE — Telephone Encounter (Signed)
-----   Message from Trinna Post, [place] sent at 10/21/2020 12:51 PM EST ----- Urine culture negative for bacteria.

## 2020-10-22 NOTE — Telephone Encounter (Signed)
Patient notified of results- he states he did take the antibiotics and symptoms are better.

## 2020-10-22 NOTE — Telephone Encounter (Signed)
Called patient and no answer, Mark Hill. If patient calls back okay for pEC to advise.

## 2020-11-12 DIAGNOSIS — H40003 Preglaucoma, unspecified, bilateral: Secondary | ICD-10-CM | POA: Diagnosis not present

## 2021-02-03 ENCOUNTER — Ambulatory Visit: Payer: Federal, State, Local not specified - PPO | Admitting: Podiatry

## 2021-02-03 ENCOUNTER — Other Ambulatory Visit: Payer: Self-pay

## 2021-02-03 ENCOUNTER — Encounter: Payer: Self-pay | Admitting: Podiatry

## 2021-02-03 DIAGNOSIS — L84 Corns and callosities: Secondary | ICD-10-CM | POA: Diagnosis not present

## 2021-02-03 DIAGNOSIS — M7742 Metatarsalgia, left foot: Secondary | ICD-10-CM

## 2021-02-03 DIAGNOSIS — M7741 Metatarsalgia, right foot: Secondary | ICD-10-CM

## 2021-02-03 NOTE — Progress Notes (Signed)
  Subjective:  Patient ID: Mark Catholic., male    DOB: 07/30/1961,  MRN: [id number]  Chief Complaint  Patient presents with  . Callouses    Patient presents today for painful calluses bottom of bilat forefoot.  He says he has sharp pains when walking but the pains come and go    60 y.o. male presents with the above complaint. History confirmed with patient.  Previously had used a blistering agent with another podiatrist  Objective:  Physical Exam: warm, good capillary refill, no trophic changes or ulcerative lesions, normal DP and PT pulses and normal sensory exam.  Thin fat pad at the plantar foot bilaterally.  Pain on palpation of the distal forefoot plantar Left Foot: Submetatarsal 2, 5, fifth metatarsal base hyperkeratosis and porokeratosis Right Foot: Submetatarsal 3 hyperkeratosis  Assessment:   1. Callus of foot   2. Metatarsalgia of both feet      Plan:  Patient was evaluated and treated and all questions answered.  All symptomatic hyperkeratoses were safely debrided with a sterile #15 blade to patient's level of comfort without incident. We discussed preventative and palliative care of these lesions including supportive and accommodative shoegear, padding, prefabricated and custom molded accommodative orthoses, use of a pumice stone and lotions/creams daily.  I also discussed with him of this relates to his job type in the pressure under the forefoot.  I think you benefit from a multi density accommodative insole.  He will consider these for the future and likely will cast at next visit  Return in about 3 weeks (around 02/24/2021) for re-check calluses and cast for orthotics.

## 2021-02-03 NOTE — Patient Instructions (Signed)
Look for urea 40% cream or ointment and apply to the thickened dry skin / calluses. This can be bought over the counter, at a pharmacy or online such as [employer].  

## 2021-02-10 DIAGNOSIS — H40003 Preglaucoma, unspecified, bilateral: Secondary | ICD-10-CM | POA: Diagnosis not present

## 2021-03-12 ENCOUNTER — Other Ambulatory Visit: Payer: Federal, State, Local not specified - PPO

## 2021-03-17 ENCOUNTER — Ambulatory Visit: Payer: Federal, State, Local not specified - PPO | Admitting: Podiatry

## 2021-03-27 ENCOUNTER — Ambulatory Visit: Payer: Self-pay | Admitting: *Deleted

## 2021-03-27 DIAGNOSIS — L237 Allergic contact dermatitis due to plants, except food: Secondary | ICD-10-CM

## 2021-03-27 NOTE — Telephone Encounter (Signed)
Patient came in contact with poison ivy/ poison oak on Monday 03/24/21   The patient is experiencing itching and discomfort on their arms as well as the right side of their stomach   Please contact to further advise when possible  Attempted to call patient- no answer- mailbox full- unable to leave call back message

## 2021-03-27 NOTE — Telephone Encounter (Signed)
Patient called, no answer, unable to leave VM due to mailbox is full.  Unable to reach patient after 3 attempts by [facility] NT, will route to office for resolution.    Message from Marlis Edelson sent at 03/27/2021  1:04 PM EDT  Patient came in contact with poison ivy/ poison oak on Monday 03/24/21   The patient is experiencing itching and discomfort on their arms as well as the right side of their stomach   Please contact to further advise when possible

## 2021-03-27 NOTE — Telephone Encounter (Signed)
Attempted to call patient- no answer- mailbox full- unable to leave call back message

## 2021-03-28 MED ORDER — TRIAMCINOLONE ACETONIDE 0.5 % EX CREA: 1.0000 "application " | TOPICAL_CREAM | Freq: Two times a day (BID) | CUTANEOUS | 1 refills | Status: DC

## 2021-03-28 NOTE — Telephone Encounter (Addendum)
Patient calling back- patient states he was exposed to Poison oak/ivy while working on Monday- patient has rash on both arms and a few bumps on torso. Patient has been treating with a wash designed to dry the oils- but it is not clearing up. Patient request appointment- or treatment. Call to office- request call for PCP review and possible treatment. Patient to expect call back   Reason for Disposition  [1] Skin around the rash has become red AND [2] larger than 2 inches (5 cm)  Answer Assessment - Initial Assessment Questions 1. APPEARANCE of RASH: "Describe the rash."      Bumps, itching both arms and R side of abdomen 2. LOCATION: "Where is the rash located?"  (e.g., face, genitals, hands, legs)     Both arms and R side 3. SIZE: "How large is the rash?"      Elbow to wrist on arms, few bumps that itch 4. ONSET: "When did the rash begin?"      03/24/21 5. ITCHING: "Does the rash itch?" If Yes, ask: "How bad is it?"   - MILD - doesn't interfere with normal activities   - MODERATE-SEVERE: interferes with work, school, sleep, or other activities      moderate 6. EXPOSURE:  "How were you exposed to the plant (poison ivy, poison oak, sumac)"  "When were you exposed?"       Monday- not sure 7. PAST HISTORY: "Have you had a poison ivy rash before?" If Yes, ask: "How bad was it?"     Yes- long time ago 8. PREGNANCY: "Is there any chance you are pregnant?" "When was your last menstrual period?"     N/a  Protocols used: [facility]

## 2021-03-28 NOTE — Addendum Note (Signed)
Addended by: Birdie Sons on: 03/28/2021 02:48 PM   Modules accepted: Orders

## 2021-03-28 NOTE — Telephone Encounter (Signed)
Patient advised.

## 2021-03-28 NOTE — Telephone Encounter (Signed)
Have sent prescription [facility]

## 2021-04-02 ENCOUNTER — Ambulatory Visit: Payer: Self-pay | Admitting: *Deleted

## 2021-04-02 DIAGNOSIS — L237 Allergic contact dermatitis due to plants, except food: Secondary | ICD-10-CM

## 2021-04-02 NOTE — Telephone Encounter (Signed)
Patient called to request call back from [facility] , regarding treatment for poison ivy. 2 attempts made to contact patient at this time.

## 2021-04-02 NOTE — Telephone Encounter (Signed)
Called patient back to review questions for treatment for poison ivy. Opal Sidles not available at this time. No answer, voicemail full. Unable to leave message.

## 2021-04-02 NOTE — Telephone Encounter (Signed)
Called patient 3 rd attempt to review treatment for poison ivy. No answer, mail box full. Unable to leave voicemail.

## 2021-04-02 NOTE — Telephone Encounter (Signed)
Summary: Poison ivy Mark Hill call back request)   Pt called and would like to speak to Mark Hill specifically regarding his questions about treatment for poison ivy   Best contact [phone]     Attempted to call patient back- no answer and mailbox is full and can not leave message.

## 2021-04-03 NOTE — Telephone Encounter (Signed)
Reason for Disposition . [1] Caller has NON-URGENT medicine question about med that PCP prescribed AND [2] triager unable to answer question  Answer Assessment - Initial Assessment Questions 1. NAME of MEDICATION: "What medicine are you calling about?"     Kenalog cream 2. QUESTION: "What is your question?" (e.g., double dose of medicine, side effect)     Once it's gone will I need to use something else or get another refill 3. PRESCRIBING HCP: "Who prescribed it?" Reason: if prescribed by specialist, call should be referred to that group.     Dr. Caryn Section 4. SYMPTOMS: "Do you have any symptoms?"     No 5. SEVERITY: If symptoms are present, ask "Are they mild, moderate or severe?"     N/A 6. PREGNANCY:  "Is there any chance that you are pregnant?" "When was your last menstrual period?"     N/A  Protocols used: Medication Question Call-A-AH

## 2021-04-03 NOTE — Telephone Encounter (Signed)
Patient called and says he was prescribed Kenalog cream for poison ivy a few weeks ago and is wanting to know once he's done with this prescription will he be able to get another refill or use something else if the poison ivy rash not gone. I asked is the rash better. He says the rash is clearing up, much better than before the kenalog cream. He says he is on the last tube of 3 that he received, says he's going to the beach in 1 week and if the rash isn't cleared up what should he do. I advised I will send this to Dr. Caryn Section and someone will call back with his recommendation. Patient verbalized understanding.

## 2021-04-04 NOTE — Telephone Encounter (Signed)
Tried calling patient. Left message to call back. OK for [facility] triage to advise and refill medication if patient wants refill.

## 2021-04-04 NOTE — Addendum Note (Signed)
Addended by: Randal Buba on: 04/04/2021 04:01 PM   Modules accepted: Orders

## 2021-04-04 NOTE — Telephone Encounter (Signed)
OK to send refill for triamcinolone cream if it wants.

## 2021-04-08 MED ORDER — TRIAMCINOLONE ACETONIDE 0.5 % EX CREA: 1.0000 "application " | TOPICAL_CREAM | Freq: Two times a day (BID) | CUTANEOUS | 1 refills | Status: DC

## 2021-04-08 NOTE — Telephone Encounter (Signed)
I called and spoke with patient. He states he would like to have prescription sent into pharmacy. [facility] [place]

## 2021-04-08 NOTE — Addendum Note (Signed)
Addended by: Randal Buba on: 04/08/2021 08:35 AM   Modules accepted: Orders

## 2021-05-15 ENCOUNTER — Telehealth: Payer: Self-pay

## 2021-05-15 NOTE — Telephone Encounter (Signed)
Pt called back to check status of his request, wants to speak to the clinic/PCP as soon as possible please advise.

## 2021-05-15 NOTE — Telephone Encounter (Signed)
Copied from [place] [id number]. Topic: General - Other >> May 15, 2021 12:23 PM Tessa Lerner A wrote: Reason for CRM: Patient shares that they've previously seen Dr. Caryn Section for urinary discomfort  The patient noticed their urine had an odor yesterday 05/14/21  The patient would like to be prescribed something for discomfort when possible  Dr. Maralyn Sago earliest available appt was 06/16/21 at the time of call  Please contact further

## 2021-05-16 ENCOUNTER — Encounter: Payer: Self-pay | Admitting: Family Medicine

## 2021-05-16 ENCOUNTER — Other Ambulatory Visit: Payer: Self-pay

## 2021-05-16 ENCOUNTER — Ambulatory Visit: Payer: Federal, State, Local not specified - PPO | Admitting: Family Medicine

## 2021-05-16 VITALS — BP 118/68 | HR 75 | Temp 98.1°F | Wt 185.6 lb

## 2021-05-16 DIAGNOSIS — N3090 Cystitis, unspecified without hematuria: Secondary | ICD-10-CM

## 2021-05-16 DIAGNOSIS — N390 Urinary tract infection, site not specified: Secondary | ICD-10-CM

## 2021-05-16 LAB — POCT URINALYSIS DIPSTICK
Bilirubin, UA: NEGATIVE
Glucose, UA: NEGATIVE
Ketones, UA: NEGATIVE
Nitrite, UA: POSITIVE
Protein, UA: POSITIVE — AB
Spec Grav, UA: >=1.03 — AB (ref 1.010–1.025)
Urobilinogen, UA: 0.2 E.U./dL
pH, UA: 6 (ref 5.0–8.0)

## 2021-05-16 MED ORDER — SULFAMETHOXAZOLE-TRIMETHOPRIM 800-160 MG PO TABS: 1.0000 | ORAL_TABLET | Freq: Two times a day (BID) | ORAL | 0 refills | Status: AC

## 2021-05-16 NOTE — Telephone Encounter (Signed)
I called patient and added him to Dr. Maralyn Sago schedule today at [place]. Patient advised and accepted appointment.

## 2021-05-16 NOTE — Telephone Encounter (Signed)
Patient checking on the status of rx for possible UTI, patient would like a follow up call as soon as possible best # [phone]    [facility] [id number] [place] [address], [place] Phone:  [phone]  Fax:  [phone]

## 2021-05-16 NOTE — Progress Notes (Signed)
Established patient visit   Patient: Mark Hill.   DOB: 1961-01-04   60 y.o. Male  MRN: [id number] Visit Date: 05/16/2021  Today's healthcare provider: Lelon Huh, MD   Chief Complaint  Patient presents with   Urinary Tract Infection   Subjective    Urinary Tract Infection    Urinary symptoms  He reports new onset  unsteady urine output and weak associated with odor. Patient denies any burning, uncomfortable sensation . The current episode started  2 days ago and is gradually worsening. Patient states symptoms are 4/10 in intensity, occurring constantly. He  has not been recently treated for similar symptoms.    Associated symptoms: No abdominal pain Yes back pain (possible muscle strain from work)  No chills No constipation  No cramping No diarrhea  No discharge Yes fever last night (101.5)  No hematuria No nausea  No vomiting     Patient took ibuprofen last night after fever and has not had fever since He does have long history of recurrent UTIs and reports he is followed by Dr. Eliberto Ivory who prescribed rapaflo and finasteride.  ---------------------------------------------------------------------------------------      Medications: Outpatient Medications Prior to Visit  Medication Sig   cetirizine (ZYRTEC) 10 MG tablet Take 10 mg by mouth daily.   finasteride (PROSCAR) 5 MG tablet Take 1 tablet by mouth daily.   ibuprofen (ADVIL,MOTRIN) 200 MG tablet Take 400 mg by mouth every 6 (six) hours as needed for headache.    latanoprost (XALATAN) 0.005 % ophthalmic solution SMARTSIG:In Eye(s)   magnesium 30 MG tablet Take 30 mg by mouth 2 (two) times daily.   Probiotic Product (PROBIOTIC DAILY PO) Take by mouth.   RAPAFLO 8 MG CAPS capsule Take 1 capsule by mouth daily.   triamcinolone cream (KENALOG) 0.5 % Apply 1 application topically 2 (two) times daily. (Patient not taking: Reported on 05/16/2021)   No facility-administered medications prior to visit.     Review of Systems     Objective    BP 118/68 (BP Location: Left Arm, Patient Position: Sitting, Cuff Size: Normal)   Pulse 75   Wt 185 lb 9.6 oz (84.2 kg)   SpO2 99%   BMI 27.02 kg/m     Physical Exam  General appearance:  Overweight male, cooperative and in no acute distress Head: Normocephalic, without obvious abnormality, atraumatic Respiratory: Respirations even and unlabored, normal respiratory rate Extremities: All extremities are intact.  Skin: Skin color, texture, turgor normal. No rashes seen  Psych: Appropriate mood and affect. Neurologic: Mental status: Alert, oriented to person, place, and time, thought content appropriate.   Results for orders placed or performed in visit on 05/16/21  POCT Urinalysis Dipstick  Result Value Ref Range   Color, UA amber    Clarity, UA cloudy    Glucose, UA Negative Negative   Bilirubin, UA negative    Ketones, UA negative    Spec Grav, UA >=1.030 (A) 1.010 - 1.025   Blood, UA non hemolyzed trace    pH, UA 6.0 5.0 - 8.0   Protein, UA Positive (A) Negative   Urobilinogen, UA 0.2 0.2 or 1.0 E.U./dL   Nitrite, UA positive    Leukocytes, UA Moderate (2+) (A) Negative   Odor malodorous     Assessment & Plan     1. Recurrent cystitis  - Urine Culture  2. Urinary tract infection without hematuria, site unspecified  - sulfamethoxazole-trimethoprim (BACTRIM DS) 800-160 MG tablet; Take 1 tablet by  mouth 2 (two) times daily for 7 days.  Dispense: 14 tablet; Refill: 0        The entirety of the information documented in the History of Present Illness, Review of Systems and Physical Exam were personally obtained by me. Portions of this information were initially documented by the CMA and reviewed by me for thoroughness and accuracy.     Lelon Huh, MD  [facility] [phone] (phone) [phone] (fax)  [place]

## 2021-05-20 LAB — URINE CULTURE

## 2021-08-04 DIAGNOSIS — N401 Enlarged prostate with lower urinary tract symptoms: Secondary | ICD-10-CM | POA: Diagnosis not present

## 2021-08-04 DIAGNOSIS — Z125 Encounter for screening for malignant neoplasm of prostate: Secondary | ICD-10-CM | POA: Diagnosis not present

## 2021-08-04 DIAGNOSIS — Z79899 Other long term (current) drug therapy: Secondary | ICD-10-CM | POA: Diagnosis not present

## 2021-08-18 DIAGNOSIS — H40003 Preglaucoma, unspecified, bilateral: Secondary | ICD-10-CM | POA: Diagnosis not present

## 2021-11-17 DIAGNOSIS — H40003 Preglaucoma, unspecified, bilateral: Secondary | ICD-10-CM | POA: Diagnosis not present

## 2022-02-16 DIAGNOSIS — H40003 Preglaucoma, unspecified, bilateral: Secondary | ICD-10-CM | POA: Diagnosis not present

## 2022-06-01 DIAGNOSIS — H40003 Preglaucoma, unspecified, bilateral: Secondary | ICD-10-CM | POA: Diagnosis not present

## 2022-07-31 ENCOUNTER — Encounter: Payer: Self-pay | Admitting: Physician Assistant

## 2022-07-31 ENCOUNTER — Ambulatory Visit: Payer: Federal, State, Local not specified - PPO | Admitting: Physician Assistant

## 2022-07-31 VITALS — BP 123/78 | HR 68 | Resp 16 | Wt 194.0 lb

## 2022-07-31 DIAGNOSIS — R8290 Unspecified abnormal findings in urine: Secondary | ICD-10-CM

## 2022-07-31 DIAGNOSIS — R3915 Urgency of urination: Secondary | ICD-10-CM

## 2022-07-31 DIAGNOSIS — R319 Hematuria, unspecified: Secondary | ICD-10-CM

## 2022-07-31 DIAGNOSIS — N3090 Cystitis, unspecified without hematuria: Secondary | ICD-10-CM

## 2022-07-31 LAB — POCT URINALYSIS DIPSTICK
Bilirubin, UA: NEGATIVE
Blood, UA: POSITIVE
Glucose, UA: NEGATIVE
Ketones, UA: NEGATIVE
Nitrite, UA: POSITIVE
Protein, UA: POSITIVE — AB
Spec Grav, UA: 1.02 (ref 1.010–1.025)
Urobilinogen, UA: 0.2 E.U./dL
pH, UA: 6 (ref 5.0–8.0)

## 2022-07-31 MED ORDER — SULFAMETHOXAZOLE-TRIMETHOPRIM 800-160 MG PO TABS: 1.0000 | ORAL_TABLET | Freq: Two times a day (BID) | ORAL | 0 refills | Status: DC

## 2022-07-31 NOTE — Progress Notes (Signed)
I,April Miller,acting as a Education administrator for [employer], PA-C.,have documented all relevant documentation on the behalf of Mardene Speak, PA-C,as directed by  [employer], PA-C while in the presence of [employer], PA-C.  Established patient visit   Patient: Mark Hill.   DOB: 16-Mar-1961   61 y.o. Male  MRN: [id number] Visit Date: 07/31/2022  Today's healthcare provider: Mardene Speak, PA-C  CC: possible UTI  Subjective    HPI  Patient states his urine has a slight odor and the flow is less. He wants to get his urine checked. Has some slight urgency.   Medications: Outpatient Medications Prior to Visit  Medication Sig   cetirizine (ZYRTEC) 10 MG tablet Take 10 mg by mouth daily.   finasteride (PROSCAR) 5 MG tablet Take 1 tablet by mouth daily.   ibuprofen (ADVIL,MOTRIN) 200 MG tablet Take 400 mg by mouth every 6 (six) hours as needed for headache.    latanoprost (XALATAN) 0.005 % ophthalmic solution SMARTSIG:In Eye(s)   Probiotic Product (PROBIOTIC DAILY PO) Take by mouth.   RAPAFLO 8 MG CAPS capsule Take 1 capsule by mouth daily.   [DISCONTINUED] magnesium 30 MG tablet Take 30 mg by mouth 2 (two) times daily. (Patient not taking: Reported on 07/31/2022)   [DISCONTINUED] triamcinolone cream (KENALOG) 0.5 % Apply 1 application topically 2 (two) times daily. (Patient not taking: Reported on 05/16/2021)   No facility-administered medications prior to visit.    Review of Systems  All other systems reviewed and are negative.  Except see HPI     Objective    BP 123/78 (BP Location: Left Arm, Patient Position: Sitting, Cuff Size: Large)   Pulse 68   Resp 16   Wt 194 lb (88 kg)   SpO2 100%   BMI 28.24 kg/m    Physical Exam Vitals reviewed.  Constitutional:      General: He is in acute distress.     Appearance: Normal appearance. He is not diaphoretic.  HENT:     Head: Normocephalic and atraumatic.  Eyes:     General: No scleral icterus.     Conjunctiva/sclera: Conjunctivae normal.  Cardiovascular:     Rate and Rhythm: Normal rate and regular rhythm.     Pulses: Normal pulses.     Heart sounds: Normal heart sounds. No murmur heard. Pulmonary:     Effort: Pulmonary effort is normal. No respiratory distress.     Breath sounds: Normal breath sounds. No wheezing or rhonchi.  Abdominal:     Tenderness: There is no right CVA tenderness or left CVA tenderness.  Musculoskeletal:     Cervical back: Neck supple.     Right lower leg: No edema.     Left lower leg: No edema.  Lymphadenopathy:     Cervical: No cervical adenopathy.  Skin:    General: Skin is warm and dry.     Findings: No rash.  Neurological:     Mental Status: He is alert and oriented to person, place, and time. Mental status is at baseline.  Psychiatric:        Behavior: Behavior normal.        Thought Content: Thought content normal.        Judgment: Judgment normal.      No results found for any visits on 07/31/22.  Assessment & Plan     1. Abnormal urine odor Could be due to UTI - POCT urinalysis dipstick - CULTURE, URINE COMPREHENSIVE - Urinalysis, Routine w  reflex microscopic - sulfamethoxazole-trimethoprim (BACTRIM DS) 800-160 MG tablet; Take 1 tablet by mouth 2 (two) times daily.  Dispense: 14 tablet; Refill: 0  2. Urinary urgency Could be due to UTI Similar episode in 05/2021 - sulfamethoxazole-trimethoprim (BACTRIM DS) 800-160 MG tablet; Take 1 tablet by mouth 2 (two) times daily.  Dispense: 14 tablet; Refill: 0  3. Recurrent cystitis Will see Urology in 7 days  4. Hematuria, unspecified type Will check with Urology  The patient was advised to call back or seek an in-person evaluation if the symptoms worsen or if the condition fails to improve as anticipated.  I discussed the assessment and treatment plan with the patient. The patient was provided an opportunity to ask questions and all were answered. The patient agreed with the plan and  demonstrated an understanding of the instructions.  The entirety of the information documented in the History of Present Illness, Review of Systems and Physical Exam were personally obtained by me. Portions of this information were initially documented by the CMA and reviewed by me for thoroughness and accuracy.  Portions of this note were created using dictation software and may contain typographical errors.    Mardene Speak, PA-C  [facility] [phone] (phone) [phone] (fax)  [place]

## 2022-08-01 LAB — URINALYSIS, ROUTINE W REFLEX MICROSCOPIC
Bilirubin, UA: NEGATIVE
Glucose, UA: NEGATIVE
Ketones, UA: NEGATIVE
Nitrite, UA: NEGATIVE
Protein,UA: NEGATIVE
Specific Gravity, UA: 1.015 (ref 1.005–1.030)
Urobilinogen, Ur: 0.2 mg/dL (ref 0.2–1.0)
pH, UA: 5.5 (ref 5.0–7.5)

## 2022-08-01 LAB — MICROSCOPIC EXAMINATION
Casts: NONE SEEN /lpf
RBC, Urine: NONE SEEN /hpf (ref 0–2)
WBC, UA: >30 /hpf — AB (ref 0–5)

## 2022-08-06 LAB — CULTURE, URINE COMPREHENSIVE

## 2022-08-06 NOTE — Progress Notes (Signed)
Please, let pt know that he does not need to change abx, his urine culture is susceptible to E.coli

## 2022-08-10 DIAGNOSIS — Z79899 Other long term (current) drug therapy: Secondary | ICD-10-CM | POA: Diagnosis not present

## 2022-08-10 DIAGNOSIS — Z125 Encounter for screening for malignant neoplasm of prostate: Secondary | ICD-10-CM | POA: Diagnosis not present

## 2022-08-10 DIAGNOSIS — N401 Enlarged prostate with lower urinary tract symptoms: Secondary | ICD-10-CM | POA: Diagnosis not present

## 2023-03-02 ENCOUNTER — Encounter: Payer: Self-pay | Admitting: Family Medicine

## 2023-03-02 ENCOUNTER — Ambulatory Visit: Payer: Federal, State, Local not specified - PPO | Admitting: Family Medicine

## 2023-03-02 VITALS — BP 132/74 | HR 82 | Temp 98.3°F | Resp 12 | Ht 69.0 in | Wt 193.0 lb

## 2023-03-02 DIAGNOSIS — N390 Urinary tract infection, site not specified: Secondary | ICD-10-CM

## 2023-03-02 DIAGNOSIS — R8290 Unspecified abnormal findings in urine: Secondary | ICD-10-CM | POA: Diagnosis not present

## 2023-03-02 DIAGNOSIS — N401 Enlarged prostate with lower urinary tract symptoms: Secondary | ICD-10-CM

## 2023-03-02 LAB — POCT URINALYSIS DIPSTICK
Bilirubin, UA: NEGATIVE
Glucose, UA: NEGATIVE
Ketones, UA: NEGATIVE
Nitrite, UA: POSITIVE
Protein, UA: NEGATIVE
Spec Grav, UA: 1.02 (ref 1.010–1.025)
Urobilinogen, UA: 0.2 E.U./dL
pH, UA: 6 (ref 5.0–8.0)

## 2023-03-02 MED ORDER — SULFAMETHOXAZOLE-TRIMETHOPRIM 800-160 MG PO TABS: 1.0000 | ORAL_TABLET | Freq: Two times a day (BID) | ORAL | 0 refills | Status: DC

## 2023-03-02 NOTE — Progress Notes (Signed)
I,[name],acting as a Neurosurgeon for Mark Merry, MD.,have documented all relevant documentation on the behalf of Mark Merry, MD,as directed by  Mark Merry, MD while in the presence of Mark Merry, MD.     Established patient visit   Patient: Mark Hill.   DOB: 1960/11/20   62 y.o. Male  MRN: [id number] Visit Date: 03/02/2023  Today's healthcare provider: [name], MD   Chief Complaint  Patient presents with   Urinary Tract Infection   Subjective    HPI HPI     Urinary Tract Infection           Chronicity: new problem   Onset: yesterday   Progression since onset: gradually improving since onset   Pain severity: none   Frequency: every urination   Abdominal pain: Absent   Back pain: Absent   Chills: Absent   Cloudy malodorus urine: Present   Constipation: Absent   Cramping: Absent   Diarrhea: Absent   Discharge: Absent   Fever: Present   Hematuria: Absent   Nausea: Absent   Vomiting: Absent       Last edited by Myles Lipps, CMA on 03/02/2023  9:22 AM.    He attributes this to going to beach and not drinking enough water over the weekend. He has had previous UTIs and previously followed by Dr. Evelene Croon for BPH, who has now retired.   Medications: Outpatient Medications Prior to Visit  Medication Sig   cetirizine (ZYRTEC) 10 MG tablet Take 10 mg by mouth daily.   finasteride (PROSCAR) 5 MG tablet Take 1 tablet by mouth daily.   ibuprofen (ADVIL,MOTRIN) 200 MG tablet Take 400 mg by mouth every 6 (six) hours as needed for headache.    latanoprost (XALATAN) 0.005 % ophthalmic solution SMARTSIG:In Eye(s)   Probiotic Product (PROBIOTIC DAILY PO) Take by mouth.   RAPAFLO 8 MG CAPS capsule Take 1 capsule by mouth daily.   sulfamethoxazole-trimethoprim (BACTRIM DS) 800-160 MG tablet Take 1 tablet by mouth 2 (two) times daily.   No facility-administered medications prior to visit.    Review of Systems  Constitutional:  Negative  for appetite change, chills and fever.  Respiratory:  Negative for chest tightness, shortness of breath and wheezing.   Cardiovascular:  Negative for chest pain and palpitations.  Gastrointestinal:  Negative for abdominal pain, nausea and vomiting.       Objective    BP 132/74 (BP Location: Left Arm, Patient Position: Sitting, Cuff Size: Large)   Pulse 82   Temp 98.3 F (36.8 C) (Temporal)   Resp 12   Ht 5\' 9"  (1.753 m)   Wt 193 lb (87.5 kg)   BMI 28.50 kg/m  BP Readings from Last 3 Encounters:  03/02/23 132/74  07/31/22 123/78  05/16/21 118/68   Wt Readings from Last 3 Encounters:  03/02/23 193 lb (87.5 kg)  07/31/22 194 lb (88 kg)  05/16/21 185 lb 9.6 oz (84.2 kg)    Physical Exam  General appearance: Well developed, well nourished male, cooperative and in no acute distress Head: Normocephalic, without obvious abnormality, atraumatic Respiratory: Respirations even and unlabored, normal respiratory rate Extremities: All extremities are intact.  Skin: Skin color, texture, turgor normal. No rashes seen  Psych: Appropriate mood and affect. Neurologic: Mental status: Alert, oriented to person, place, and time, thought content appropriate.   Results for orders placed or performed in visit on 03/02/23  POCT urinalysis dipstick  Result Value Ref Range   Color, UA yellow  Clarity, UA cloudy    Glucose, UA Negative Negative   Bilirubin, UA Negative    Ketones, UA Negative    Spec Grav, UA 1.020 1.010 - 1.025   Blood, UA trace    pH, UA 6.0 5.0 - 8.0   Protein, UA Negative Negative   Urobilinogen, UA 0.2 0.2 or 1.0 E.U./dL   Nitrite, UA Positive    Leukocytes, UA Small (1+) (A) Negative    Assessment & Plan     1. Urinary tract infection without hematuria, site unspecified Secondary to underlying BPH  - Urine Culture - sulfamethoxazole-trimethoprim (BACTRIM DS) 800-160 MG tablet; Take 1 tablet by mouth 2 (two) times daily.  Dispense: 14 tablet; Refill: 0  2.  Benign prostatic hyperplasia with lower urinary tract symptoms, symptom details unspecified Previously followed by Dr. Evelene Croon who he last saw November 2023 who has now retired. Patient reports he had PSA checked by Dr. Evelene Croon at that time. Will need referral new urologist this fall.       The entirety of the information documented in the History of Present Illness, Review of Systems and Physical Exam were personally obtained by me. Portions of this information were initially documented by the CMA and reviewed by me for thoroughness and accuracy.     Mark Merry, MD  [facility] [phone] (phone) [phone] (fax)  [facility]

## 2023-03-04 LAB — URINE CULTURE

## 2023-03-18 ENCOUNTER — Ambulatory Visit: Payer: Federal, State, Local not specified - PPO | Admitting: Family Medicine

## 2023-03-18 ENCOUNTER — Encounter: Payer: Self-pay | Admitting: Family Medicine

## 2023-03-18 VITALS — BP 123/75 | HR 63 | Temp 97.8°F | Resp 16 | Wt 194.0 lb

## 2023-03-18 DIAGNOSIS — L237 Allergic contact dermatitis due to plants, except food: Secondary | ICD-10-CM

## 2023-03-18 MED ORDER — CLOBETASOL PROPIONATE 0.05 % EX CREA
1.0000 | TOPICAL_CREAM | Freq: Two times a day (BID) | CUTANEOUS | 0 refills | Status: AC
Start: 2023-03-18 — End: 2023-03-25

## 2023-03-18 NOTE — Progress Notes (Signed)
I,[name],acting as a Neurosurgeon for [employer], MD.,have documented all relevant documentation on the behalf of Ronnald Ramp, MD,as directed by  Ronnald Ramp, MD while in the presence of Ronnald Ramp, MD.     Established patient visit   Patient: Mark Hill.   DOB: 08-12-1961   62 y.o. Male  MRN: [id number] Visit Date: 03/18/2023  Today's healthcare provider: Ronnald Ramp, MD   Chief Complaint  Patient presents with   Rash   Subjective    Rash This is a new problem. The current episode started in the past 7 days. The problem has been gradually worsening since onset. The affected locations include the left hand, left fingers, left arm, right buttock, right fingers, right arm and right hand. The rash is characterized by blistering, burning, redness and itchiness. He was exposed to plant contact. Pertinent negatives include no cough or fever. Past treatments include anti-itch cream. The treatment provided mild relief.     Medications: Outpatient Medications Prior to Visit  Medication Sig   cetirizine (ZYRTEC) 10 MG tablet Take 10 mg by mouth daily.   finasteride (PROSCAR) 5 MG tablet Take 1 tablet by mouth daily.   ibuprofen (ADVIL,MOTRIN) 200 MG tablet Take 400 mg by mouth every 6 (six) hours as needed for headache.    latanoprost (XALATAN) 0.005 % ophthalmic solution SMARTSIG:In Eye(s)   Probiotic Product (PROBIOTIC DAILY PO) Take by mouth.   RAPAFLO 8 MG CAPS capsule Take 1 capsule by mouth daily.   [DISCONTINUED] sulfamethoxazole-trimethoprim (BACTRIM DS) 800-160 MG tablet Take 1 tablet by mouth 2 (two) times daily. (Patient not taking: Reported on 03/18/2023)   No facility-administered medications prior to visit.    Review of Systems  Constitutional:  Negative for chills and fever.  Respiratory:  Negative for cough, choking and wheezing.   Skin:  Positive for color change and rash.       Objective     BP 123/75 (BP Location: Left Arm, Patient Position: Sitting, Cuff Size: Large)   Pulse 63   Temp 97.8 F (36.6 C) (Temporal)   Resp 16   Wt 194 lb (88 kg)   BMI 28.65 kg/m    Physical Exam Constitutional:      General: He is not in acute distress.    Appearance: He is not ill-appearing.  Pulmonary:     Effort: Pulmonary effort is normal. No respiratory distress.  Skin:      Neurological:     Mental Status: He is alert.       No results found for any visits on 03/18/23.  Assessment & Plan     1. Contact dermatitis due to poison oak Less than 10% of SA involved with rash, no mucosa surface involved, discussed topical vs systemic treatment options, counseled patient of concerns for systemic treatment given mild nature of rash, patient agreeable with topical treatment  - prescribed clobetasol cream (TEMOVATE) 0.05 %; Apply 1 Application topically 2 (two) times daily for 7 days.  Dispense: 45 g; Refill: 0 - counseled to return to care if symptoms worsened despite therapy         Return if symptoms worsen or fail to improve.        The entirety of the information documented in the History of Present Illness, Review of Systems and Physical Exam were personally obtained by me. Portions of this information were initially documented by [facility]. I, Ronnald Ramp, MD have reviewed the documentation above for thoroughness and accuracy.  Ronnald Ramp, MD  [facility] [phone] (phone) [phone] (fax)  [facility]

## 2023-07-07 ENCOUNTER — Other Ambulatory Visit: Payer: Self-pay | Admitting: Family Medicine

## 2023-07-07 DIAGNOSIS — N401 Enlarged prostate with lower urinary tract symptoms: Secondary | ICD-10-CM

## 2023-10-08 ENCOUNTER — Ambulatory Visit (INDEPENDENT_AMBULATORY_CARE_PROVIDER_SITE_OTHER): Payer: No Typology Code available for payment source | Admitting: Physician Assistant

## 2023-10-08 ENCOUNTER — Encounter: Payer: Self-pay | Admitting: Physician Assistant

## 2023-10-08 VITALS — BP 136/70 | HR 95 | Temp 98.7°F | Resp 16 | Ht 70.0 in | Wt 193.0 lb

## 2023-10-08 DIAGNOSIS — J069 Acute upper respiratory infection, unspecified: Secondary | ICD-10-CM

## 2023-10-08 DIAGNOSIS — J0100 Acute maxillary sinusitis, unspecified: Secondary | ICD-10-CM | POA: Diagnosis not present

## 2023-10-08 MED ORDER — AMOXICILLIN-POT CLAVULANATE 875-125 MG PO TABS: 1.0000 | ORAL_TABLET | Freq: Two times a day (BID) | ORAL | 0 refills | Status: DC

## 2023-10-08 NOTE — Progress Notes (Signed)
 Acute Office Visit   Patient: Mark Hill.   DOB: 06-Aug-1961   63 y.o. Male  MRN: [id number] Visit Date: 10/08/2023  Today's healthcare provider: Rocky BRAVO Merie Wulf, PA-C  Introduced myself to the patient as a PA-C and provided education on APPs in clinical practice.    Chief Complaint  Patient presents with   Nasal Congestion   Cough    Productive, OTC meds help.   Sore Throat    Taking OTC throat meds   Subjective    HPI HPI     Cough    Additional comments: Productive, OTC meds help.        Sore Throat    Additional comments: Taking OTC throat meds      Last edited by Dann Kirsch, CMA on 10/08/2023  1:38 PM.      URI -type symptoms   Onset: gradual  Duration: over the weekend/ beginning of the week- states symptoms have gotten worse  Associated symptoms: sore throat, productive coughing, postnasal drainage Reports coughing seems to get worse at night    Intervention: he has been taking lozenges for sore throat, Robitussin and daily allergy pill   Recent sick contacts: he denies known sick contacts - states he wears a mask at work 95% of the time  Recent travel: none COVID testing at home: he has not tested at home   Result: NA    Medications: Outpatient Medications Prior to Visit  Medication Sig   cetirizine (ZYRTEC) 10 MG tablet Take 10 mg by mouth daily.   finasteride  (PROSCAR ) 5 MG tablet Take 1 tablet by mouth daily.   ibuprofen (ADVIL,MOTRIN) 200 MG tablet Take 400 mg by mouth every 6 (six) hours as needed for headache.    latanoprost (XALATAN) 0.005 % ophthalmic solution SMARTSIG:In Eye(s)   Probiotic Product (PROBIOTIC DAILY PO) Take by mouth.   RAPAFLO  8 MG CAPS capsule Take 1 capsule by mouth daily.   No facility-administered medications prior to visit.    Review of Systems  Constitutional:  Positive for fatigue. Negative for chills and fever.  HENT:  Positive for postnasal drip, rhinorrhea, sore throat and voice change.  Negative for congestion, ear pain, sinus pressure and sinus pain.   Respiratory:  Positive for cough. Negative for shortness of breath and wheezing.   Gastrointestinal:  Negative for diarrhea, nausea and vomiting.  Musculoskeletal:  Negative for myalgias.  Neurological:  Negative for headaches.        Objective    BP 136/70   Pulse 95   Temp 98.7 F (37.1 C) (Oral)   Resp 16   Ht 5' 10 (1.778 m)   Wt 193 lb (87.5 kg)   SpO2 99%   BMI 27.69 kg/m     Physical Exam Vitals reviewed.  Constitutional:      General: He is awake.     Appearance: Normal appearance. He is well-developed and well-groomed.  HENT:     Head: Normocephalic and atraumatic.     Right Ear: Hearing, tympanic membrane and ear canal normal.     Left Ear: Hearing, tympanic membrane and ear canal normal.     Mouth/Throat:     Lips: Pink.     Pharynx: Uvula midline. Posterior oropharyngeal erythema and postnasal drip present. No pharyngeal swelling, oropharyngeal exudate or uvula swelling.     Tonsils: No tonsillar exudate or tonsillar abscesses.  Eyes:     General: Lids are normal. Gaze aligned  appropriately.  Cardiovascular:     Rate and Rhythm: Normal rate and regular rhythm.     Heart sounds: Normal heart sounds. No murmur heard.    No friction rub. No gallop.  Pulmonary:     Effort: Pulmonary effort is normal.     Breath sounds: Normal breath sounds. No decreased air movement. No decreased breath sounds, rhonchi or rales.  Lymphadenopathy:     Head:     Right side of head: No submental, submandibular or preauricular adenopathy.     Left side of head: No submental, submandibular or preauricular adenopathy.     Cervical:     Right cervical: No superficial cervical adenopathy.    Left cervical: No superficial cervical adenopathy.     Upper Body:     Right upper body: No supraclavicular adenopathy.     Left upper body: No supraclavicular adenopathy.  Neurological:     Mental Status: He is alert.   Psychiatric:        Behavior: Behavior is cooperative.       No results found for any visits on 10/08/23.  Assessment & Plan      No follow-ups on file.      Problem List Items Addressed This Visit   None Visit Diagnoses       Upper respiratory tract infection, unspecified type    -  Primary   Relevant Medications   amoxicillin -clavulanate (AUGMENTIN ) 875-125 MG tablet     Acute maxillary sinusitis, recurrence not specified       Relevant Medications   amoxicillin -clavulanate (AUGMENTIN ) 875-125 MG tablet      Acute, new concern  Reports ongoing sinus congestion, fatigue, postnasal drainage, coughing that has not improved with home measures, ongoing for over a week Symptoms appear consistent with sinusitis  Will send in script for Augmentin  PO BID x 10 days for management Can continue OTC medications as needed for symptomatic management Reviewed ED and return precautions Follow up as needed for persistent or progressing symptoms     No follow-ups on file.   I, Cassity Christian E Keyasha Miah, PA-C, have reviewed all documentation for this visit. The documentation on 10/08/23 for the exam, diagnosis, procedures, and orders are all accurate and complete.   Rocky Mt, MHS, PA-C [facility]

## 2023-10-08 NOTE — Patient Instructions (Addendum)
 Based on your symptoms and duration of illness, I believe you may have a bacterial sinus infection  These typically resolve with antibiotic therapy along with at-home comfort measures  Today I have sent in a prescription for Augmentin  875-125 mg to be taken by mouth twice per day for 7 days FINISH THE ENTIRE COURSE unless you develop an allergic reaction or are instructed to discontinue.  It can take a few days for the antibiotic to kick in so I recommend symptomatic relief with over the counter medication such as the following: Dayquil/ Nyquil Theraflu Alkaseltzer  You can take Mucinex and Robitussin along with Tylenol if you have high blood pressure instead of the combo medications   These medications typically have Tylenol in them already so you do not need to supplement with more outside of those medications   I also recommend adding an antihistamine to your daily regimen This includes medications like Claritin, Allegra, Zyrtec- the generics of these work very well and are usually less expensive I recommend using Flonase nasal spray - 2 puffs twice per day to help with your nasal congestion The antihistamines and Flonase can take a few weeks to provide significant relief from allergy symptoms but should start to provide some benefit soon.   Stay well hydrated with at least 75 oz of water per day to help with recovery  If you notice any of the following please let us  know: increased fever not responding to Tylenol or Ibuprofen, swelling around your nose or eyes, difficulty seeing,

## 2023-10-29 ENCOUNTER — Encounter: Payer: Self-pay | Admitting: Family Medicine

## 2023-10-29 ENCOUNTER — Ambulatory Visit (INDEPENDENT_AMBULATORY_CARE_PROVIDER_SITE_OTHER): Payer: Self-pay | Admitting: Family Medicine

## 2023-10-29 VITALS — BP 144/76 | HR 84 | Ht 70.0 in | Wt 193.9 lb

## 2023-10-29 DIAGNOSIS — L0291 Cutaneous abscess, unspecified: Secondary | ICD-10-CM

## 2023-10-29 MED ORDER — SULFAMETHOXAZOLE-TRIMETHOPRIM 800-160 MG PO TABS: 1.0000 | ORAL_TABLET | Freq: Two times a day (BID) | ORAL | 0 refills | Status: DC

## 2023-10-29 NOTE — Progress Notes (Signed)
Established patient visit   Patient: Mark Hill.   DOB: 03-16-1961   63 y.o. Male  MRN: [id number] Visit Date: 10/29/2023  Today's healthcare provider: Ronnald Ramp, MD   Chief Complaint  Patient presents with   Skin Problem    Abcess on the back   Subjective     HPI     Skin Problem    Additional comments: Abcess on the back      Last edited by Bevelyn Ngo, CMA on 10/29/2023  1:48 PM.       Discussed the use of AI scribe software for clinical note transcription with the patient, who gave verbal consent to proceed.  History of Present Illness   The patient, known to have a medical history of abscesses, presents with a new skin lesion on his back. He describes it as a red and purplish lump, which has been present for a couple of weeks. The lesion is itchy and painful, but the pain is not unbearable. The patient notes that the lesion might have been caused by rubbing his back against something while working outside, as he has had similar experiences in the past.  The lesion does not have an open wound or a head that could be lanced and drained. The patient reports no associated fever. The lesion is indurated and measures approximately six centimeters long and eight centimeters wide. The patient denies any significant tenderness, except when lying down or leaning against something.  The patient was previously on amoxicillin for a sinus infection, which was present before the onset of the current skin lesion. He has no known history of MRSA skin infections. The patient has had a similar experience in the past with a cyst in his neck.         Past Medical History:  Diagnosis Date   Allergy    seasonal    Medications: Outpatient Medications Prior to Visit  Medication Sig   cetirizine (ZYRTEC) 10 MG tablet Take 10 mg by mouth daily.   finasteride (PROSCAR) 5 MG tablet Take 1 tablet by mouth daily.   ibuprofen (ADVIL,MOTRIN) 200 MG tablet Take  400 mg by mouth every 6 (six) hours as needed for headache.    latanoprost (XALATAN) 0.005 % ophthalmic solution SMARTSIG:In Eye(s)   Probiotic Product (PROBIOTIC DAILY PO) Take by mouth.   RAPAFLO 8 MG CAPS capsule Take 1 capsule by mouth daily.   amoxicillin-clavulanate (AUGMENTIN) 875-125 MG tablet Take 1 tablet by mouth 2 (two) times daily. (Patient not taking: Reported on 10/29/2023)   No facility-administered medications prior to visit.    Review of Systems      Objective    BP (!) 144/76   Pulse 84   Ht 5\' 10"  (1.778 m)   Wt 193 lb 14.4 oz (88 kg)   SpO2 98%   BMI 27.82 kg/m  BP Readings from Last 3 Encounters:  10/29/23 (!) 144/76  10/08/23 136/70  03/18/23 123/75   Wt Readings from Last 3 Encounters:  10/29/23 193 lb 14.4 oz (88 kg)  10/08/23 193 lb (87.5 kg)  03/18/23 194 lb (88 kg)        Physical Exam  Physical Exam   SKIN: Lesion on left back measuring 6 cm by 8 cm, mildly tender, indurated with overlying erythema in central area, ill-defined borders. Suspected to be an abscess, potentially phlegmon, not consistent with lipoma.        No results found for any visits  on 10/29/23.  Assessment & Plan     Problem List Items Addressed This Visit   None Visit Diagnoses       Abscess    -  Primary   Relevant Medications   sulfamethoxazole-trimethoprim (BACTRIM DS) 800-160 MG tablet   Other Relevant Orders   Ambulatory referral to General Surgery         Abscess on back Abscess on the back, present for a couple of weeks. The lesion is red, purplish, mildly tender, and indurated, measuring approximately 6 cm by 8 cm. No fever reported. Differential diagnosis includes abscess, phlegmon, or pseudocyst. The lesion does not have a head and is not consistent with a boil or lipoma. No history of MRSA infections. Recent use of amoxicillin for a sinus infection. - Prescribe Bactrim 800-160 mg tablets, twice daily for five days - Submit referral to general  surgery for evaluation and possible incision and drainage - Advise warm compresses to the area.       No follow-ups on file.         Ronnald Ramp, MD  [facility] [phone] (phone) [phone] (fax)  [facility]

## 2023-11-01 ENCOUNTER — Ambulatory Visit: Payer: Self-pay | Admitting: *Deleted

## 2023-11-01 DIAGNOSIS — L0291 Cutaneous abscess, unspecified: Secondary | ICD-10-CM

## 2023-11-01 NOTE — Telephone Encounter (Signed)
  Chief Complaint: Started on Bactrim DS on 10/29/2023 for an abscess on his back. Symptoms: Having red, itchy patches come up both times he has taken it.   Took a totol of 2 doses.   Thinks he may be allergic to this Frequency: Since Fri.   Sat. Morning was his last dose. Pertinent Negatives: Patient denies having the itchy patches when he does not take it. Disposition: [] ED /[] Urgent Care (no appt availability in office) / [] Appointment(In office/virtual)/ []  [place] [facility]/ [] Home Care/ [] Refused Recommended Disposition /[] [place] [facility]/ [x]  Follow-up with PCP Additional Notes: Pt agreeable to someone calling him back with alternative medication for the abscess.   Message sent to Dr. Roxan Hockey

## 2023-11-01 NOTE — Telephone Encounter (Signed)
Reason for Disposition  Medicine patch causing local rash or itching    Bactrim DS  Answer Assessment - Initial Assessment Questions 1. NAME of MEDICINE: "What medicine(s) are you calling about?"  It's sulfamethoxazole.   Bactrim DS.  I was prescribed this for an abscess on my back by Dr. Roxan Hockey on 10/29/2023.    I took my first dose Friday but I started having itchy red patches come up on me.   I didn't take any more but tried again Sat. And the itchy red patches came back.   So I think I'm allergic to this. 2. QUESTION: "What is your question?" (e.g., double dose of medicine, side effect)     I'm having a reaction to the medicine.     I only took 2 pills total and both times broke out in this red itchy patchy rash.  3. PRESCRIBER: "Who prescribed the medicine?" Reason: if prescribed by specialist, call should be referred to that group.     Dr. Neita Garnet 4. SYMPTOMS: "Do you have any symptoms?" If Yes, ask: "What symptoms are you having?"  "How bad are the symptoms (e.g., mild, moderate, severe)     Red itchy patches on his side and thighs 5. PREGNANCY:  "Is there any chance that you are pregnant?" "When was your last menstrual period?"     N/A  Protocols used: Medication Question Call-A-AH

## 2023-11-02 NOTE — Progress Notes (Unsigned)
Patient ID: Mark Hill., male   DOB: 1961-06-07, 63 y.o.   MRN: [id number]  Chief Complaint: Tender mass left back  History of Present Illness Mark Hill. is a 63 y.o. male with a mass present for about 2 weeks, progressively more painful/tender.  No certainty of trauma, however may have brushed against object.  Denies any fevers or chills.  Had a spot of drainage on clothing, had worn Band-Aid but no perceptible area from which it drained.  Had recent reaction to sulfa medication, and therefore allergic to these.  Recent prescription for Augmentin, not yet filled.  Prior history of dermal cysts.  Past Medical History Past Medical History:  Diagnosis Date   Allergy    seasonal      Past Surgical History:  Procedure Laterality Date   COLONOSCOPY N/A 03/29/2015   Procedure: COLONOSCOPY;  Surgeon: Christena Deem, MD;  Location: [facility] ENDOSCOPY;  Service: Endoscopy;  Laterality: N/A;   SHOULDER SURGERY Right 1981   SKIN LESION EXCISION  05-16-2010   back cyst   TONSILLECTOMY      Allergies  Allergen Reactions   Septra [Sulfamethoxazole-Trimethoprim] Rash    Current Outpatient Medications  Medication Sig Dispense Refill   cetirizine (ZYRTEC) 10 MG tablet Take 10 mg by mouth daily.     finasteride (PROSCAR) 5 MG tablet Take 1 tablet by mouth daily.     ibuprofen (ADVIL,MOTRIN) 200 MG tablet Take 400 mg by mouth every 6 (six) hours as needed for headache.      latanoprost (XALATAN) 0.005 % ophthalmic solution SMARTSIG:In Eye(s)     Probiotic Product (PROBIOTIC DAILY PO) Take by mouth.     RAPAFLO 8 MG CAPS capsule Take 1 capsule by mouth daily.     amoxicillin-clavulanate (AUGMENTIN) 875-125 MG tablet Take 1 tablet by mouth 2 (two) times daily. (Patient not taking: Reported on 11/04/2023) 20 tablet 0   No current facility-administered medications for this visit.    Family History Family History  Problem Relation Age of Onset   Diabetes Brother    Congestive Heart  Failure Father    Heart attack Father        occured in his 17's   Heart disease Father       Social History Social History   Tobacco Use   Smoking status: Never   Smokeless tobacco: Never  Substance Use Topics   Alcohol use: Yes    Alcohol/week: 0.0 standard drinks of alcohol    Comment: ocassionally on weekends   Drug use: No        Review of Systems  Constitutional: Negative.   HENT: Negative.    Eyes: Negative.   Respiratory: Negative.    Cardiovascular: Negative.   Gastrointestinal: Negative.   Genitourinary: Negative.   Skin:  Positive for itching.  Neurological: Negative.   Psychiatric/Behavioral: Negative.       Physical Exam Blood pressure (!) 155/88, pulse 67, temperature 98.3 F (36.8 C), temperature source Oral, height 5\' 10"  (1.778 m), weight 192 lb 3.2 oz (87.2 kg), SpO2 95%. Last Weight  Most recent update: 11/04/2023 10:35 AM    Weight  87.2 kg (192 lb 3.2 oz)             CONSTITUTIONAL: Well developed, and nourished, appropriately responsive and aware without distress.   EYES: Sclera non-icteric.   EARS, NOSE, MOUTH AND THROAT: Mask worn.  Hearing is intact to voice.  NECK: Trachea is midline, and there is no jugular venous  distension.  LYMPH NODES:  Lymph nodes in the neck are not appreciated. RESPIRATORY:   Normal respiratory effort without pathologic use of accessory muscles. CARDIOVASCULAR: Well perfused.  GI: The abdomen is  soft, nontender, and nondistended.  MUSCULOSKELETAL:  Symmetrical muscle tone appreciated in all four extremities.    SKIN: Skin turgor is normal. No pathologic skin lesions appreciated.  There is a small comedone on the left lower neck at the posterior aspect of a prior scar.  There is a rather broad based 5+ centimeter area of induration on the left thoracolumbar back.  Central within this firm area is an area of fluctuance.  The erythema is quite mild with a pink hue, no other discoloration, no other punctum or  scarring appreciated.  NEUROLOGIC:  Motor and sensation appear grossly normal.  Cranial nerves are grossly without defect. PSYCH:  Alert and oriented to person, place and time. Affect is appropriate for situation.  Data Reviewed I have personally reviewed what is currently available of the patient's imaging, recent labs and medical records.   Labs:      No data to display             No data to display          Imaging: Radiological images reviewed:   Within last 24 hrs: No results found.  Assessment    Inflamed/ruptured sebaceous cyst of the left back, possible abscess.  Patient Active Problem List   Diagnosis Date Noted   Benign prostatic hyperplasia with lower urinary tract symptoms 03/02/2023   Inflamed sebaceous cyst 08/15/2020   Recurrent cystitis 07/30/2015   Neck abscess 02/20/2014    Plan    Will proceed with I&D of left back ruptured cyst/abscess.  We discussed open wound care, risk of recurrence, need for antibiotics, intention to keep wound open until healing from within. We discussed risks of bleeding, additional scarring or need for additional debridement procedures.  He desires to proceed, having had his questions adequately answered.  I have never implied any guarantees.  Incision and drainage of left back abscess/ruptured cyst.  Pre-operative Diagnosis: Left back abscess/ruptured cyst  Post-operative Diagnosis: same, cultures pending, suspect ruptured cyst as primary etiology.  Surgeon: Campbell Lerner, M.D., FACS  Anesthesia: Local   Findings: Bloody purulent drainage without malodor, evidence of sebaceous material within the drainage.  Estimated Blood Loss: 20 mL         Specimens: Swab sent for culture and sensitivity.          Complications: none              Procedure Details  The patient was evaluated, the benefits, complications, treatment options, and expected outcomes were discussed with the patient. The risks of bleeding,  infection, recurrence of symptoms, failure to resolve symptoms, unanticipated injury, any of which could require further surgery were reviewed with the patient. The likelihood of improving the patient's symptoms with return to their baseline status is expected.  The patient and/or family concurred with the proposed plan, giving informed consent.  The patient was taken to our procedure room, identified and the procedure verified.    The patient was positioned in the prone position and the left back was prepped with  Chloraprep and draped in the sterile fashion.  A Time Out was held and the above information confirmed.  Local infiltration with 1% lidocaine with epinephrine is completed to adequate anesthetic effect to the fluctuant and surrounding indurated area.  Stab incision is made with an  11 blade allowing immediate egress of bloody purulent drainage.  I excised additional ellipses on both sides of the incision to allow further exposure to the deeper cavity as it will require some packing and dressing changes over the next few weeks.  Swabbed out the cavities drainage and noted some  sebaceous material.  Packed and applied pressure for hemostasis.  Wound was irrigated and then packed with half-inch iodoform packing strip.  Large stack of 4 x 4 gauze applied to absorb drainage.  Wound care instructions given to patient and his wife who was brought into the room at that time. Will have them follow-up in 2 weeks.  I advised he could hold onto his Augmentin prescription pending his response to drainage.  But can fill and proceed with taking it should there be any evidence or concern regarding persisting or lingering infection.   Face-to-face time spent with the patient and accompanying care providers(if present) was 45 minutes, spent counseling, educating, and coordinating care of the patient.    These notes generated with voice recognition software. I apologize for typographical errors.  Campbell Lerner M.D., FACS 11/04/2023, 12:24 PM

## 2023-11-03 MED ORDER — AMOXICILLIN-POT CLAVULANATE 875-125 MG PO TABS: 1.0000 | ORAL_TABLET | Freq: Two times a day (BID) | ORAL | 0 refills | Status: DC

## 2023-11-03 NOTE — Telephone Encounter (Signed)
Have sent prescription for Augmentin to [facility] in [place]

## 2023-11-03 NOTE — Addendum Note (Signed)
Addended by: Malva Limes on: 11/03/2023 11:35 AM   Modules accepted: Orders

## 2023-11-03 NOTE — Telephone Encounter (Signed)
Pt would like to know if it was still necessary to take considering he will be seeing surgery tomorrow or if he should wait until after he is seen to determine if this rx will benefit him. Advised at this time I can only advise that the rx has been sent but it is up to him if he would like to hold off on treatment until he is seen by surgery provider yesterday   Please advise if patient should begin rx today or if he should wait

## 2023-11-04 ENCOUNTER — Encounter: Payer: Self-pay | Admitting: Surgery

## 2023-11-04 ENCOUNTER — Ambulatory Visit: Payer: Federal, State, Local not specified - PPO | Admitting: Surgery

## 2023-11-04 VITALS — BP 155/88 | HR 67 | Temp 98.3°F | Ht 70.0 in | Wt 192.2 lb

## 2023-11-04 DIAGNOSIS — L723 Sebaceous cyst: Secondary | ICD-10-CM

## 2023-11-04 DIAGNOSIS — L02219 Cutaneous abscess of trunk, unspecified: Secondary | ICD-10-CM | POA: Diagnosis not present

## 2023-11-04 DIAGNOSIS — L03319 Cellulitis of trunk, unspecified: Secondary | ICD-10-CM | POA: Diagnosis not present

## 2023-11-04 NOTE — Patient Instructions (Signed)
Today we have drained your Abscess in the office. The numbing medication will wear off in approximately 4-8 hours. You will have some pain to the area afterwards but should not be as severe as prior to the procedure.  If you have been given antibiotics, please continue to take them after your procedure.  You may take 2 extra strength Tylenol, or 3 regular Ibuprofen tablets every 6 hours as needed for pain and discomfort.  You may shower. First remove all of the packing and wash letting the warm soapy water run over the area, rinse well, and pat dry.  We will see you back as scheduled below.   If you have any questions or concerns prior to your appointment, please call our office and speak with a nurse.  Incision and Drainage Incision and drainage is a surgical procedure to open and drain a fluid-filled sac. The sac may be filled with pus, mucus, or blood. Examples of fluid-filled sacs that may need surgical drainage include cysts, skin infections (abscesses), and red lumps that develop from a ruptured cyst or a small abscess (boils). You may need this procedure if the affected area is large, painful, infected, or not healing well. Tell a health care provider about: Any allergies you have. All medicines you are taking, including vitamins, herbs, eye drops, creams, and over-the-counter medicines. Any problems you or family members have had with anesthetic medicines. Any blood disorders you have. Any surgeries you have had. Any medical conditions you have. Whether you are pregnant or may be pregnant. What are the risks? Generally, this is a safe procedure. However, problems may occur, including: Infection. Bleeding. Allergic reactions to medicines. Scarring.  What happens before the procedure? You may need an ultrasound or other imaging tests to see how large or deep the fluid-filled sac is. You may have blood tests to check for infection. You may get a tetanus shot. You may be given  antibiotic medicine to help prevent infection. Follow instructions from your health care provider about eating or drinking restrictions. Ask your health care provider about: Changing or stopping your regular medicines. This is especially important if you are taking diabetes medicines or blood thinners. Taking medicines such as aspirin and ibuprofen. These medicines can thin your blood. Do not take these medicines before your procedure if your health care provider instructs you not to. Plan to have someone take you home after the procedure. If you will be going home right after the procedure, plan to have someone stay with you for 24 hours. What happens during the procedure? To reduce your risk of infection: Your health care team will wash or sanitize their hands. Your skin will be washed with soap. You will be given one or more of the following: A medicine to help you relax (sedative). A medicine to numb the area (local anesthetic). A medicine to make you fall asleep (general anesthetic). An incision will be made in the top of the fluid-filled sac. The contents of the sac may be squeezed out, or a syringe or tube (catheter) may be used to empty the sac. The catheter may be left in place for several weeks to drain any fluid. Or, your health care provider may stitch open the edges of the incision to make a long-term opening for drainage (marsupialization). The inside of the sac may be washed out (irrigated) with a sterile solution and packed with gauze before it is covered with a bandage (dressing). The procedure may vary among health care providers and  hospitals. What happens after the procedure? Your blood pressure, heart rate, breathing rate, and blood oxygen level will be monitored often until the medicines you were given have worn off. Do not drive for 24 hours if you received a sedative. This information is not intended to replace advice given to you by your health care provider. Make sure  you discuss any questions you have with your health care provider. Document Released: 03/17/2001 Document Revised: 02/27/2016 Document Reviewed: 07/12/2015 Elsevier Interactive Patient Education  2017 [employer]   Incision and Drainage, Care After Refer to this sheet in the next few weeks. These instructions provide you with information about caring for yourself after your procedure. Your health care provider may also give you more specific instructions. Your treatment has been planned according to current medical practices, but problems sometimes occur. Call your health care provider if you have any problems or questions after your procedure. What can I expect after the procedure? After the procedure, it is common to have: Pain or discomfort around your incision site. Drainage from your incision.  Follow these instructions at home: Take over-the-counter and prescription medicines only as told by your health care provider. If you were prescribed an antibiotic medicine, take it as told by your health care provider. Do not stop taking the antibiotic even if you start to feel better. Follow instructions from your health care provider about: How to take care of your incision. When and how you should change your packing and bandage (dressing). Wash your hands with soap and water before you change your dressing. If soap and water are not available, use hand sanitizer. When you should remove your dressing. Do not take baths, swim, or use a hot tub until your health care provider approves. Keep all follow-up visits as told by your health care provider. This is important. Check your incision area every day for signs of infection. Check for: More redness, swelling, or pain. More fluid or blood. Warmth. Pus or a bad smell. Contact a health care provider if: Your cyst or abscess returns. You have a fever. You have more redness, swelling, or pain around your incision. You have more fluid or  blood coming from your incision. Your incision feels warm to the touch. You have pus or a bad smell coming from your incision. Get help right away if: You have severe pain or bleeding. You cannot eat or drink without vomiting. You have decreased urine output. You become short of breath. You have chest pain. You cough up blood. The area where the incision and drainage occurred becomes numb or it tingles. This information is not intended to replace advice given to you by your health care provider. Make sure you discuss any questions you have with your health care provider. Document Released: 12/14/2011 Document Revised: 02/21/2016 Document Reviewed: 07/12/2015 Elsevier Interactive Patient Education  2017 [employer]  Wound Packing  Wound packing usually involves placing a moistened packing material into your wound and then covering it with an outer bandage (dressing). This helps support the healing of deep tissue and tissue under the skin. It also helps prevent bleeding, infection, and further injury. Wounds are packed until deep tissues heal. The time it takes for this to happen is different for everyone. Your health care provider will show you how to pack and dress your wound. Using gloves and a clean technique is important to avoid spreading germs into your wound. Supplies needed: Soap and water. Disposable gloves. Cleansing or wetting solution, such as saline,  germ-free (sterile) water, or an antiseptic solution. Clean bowl. Clean packing material, such as gauze, gauze sponges, or rolled gauze. Clean paper towels. Outer dressing. This includes the cover dressing and tape, or a dressing with an adhesive border. Cotton-tipped swabs. Small plastic bag for trash. How to pack your wound Follow your health care provider's instructions on how often you need to change dressings and pack your wound. You will likely be asked to change your dressings 1 to 2 times a day. Preparing to change  the wound packing If needed, take pain medicine 30 minutes before you pack your wound as told by your health care provider. Preparing the new packing material  Clean and disinfect your work surface or countertop. Set a plastic bag on or near your work surface. Wash your hands with soap and water for at least 20 seconds before you change the dressing. If soap and water are not available, use hand sanitizer. Put a clean paper towel on the counter. Put a clean bowl on the towel. Only touch the outside of the bowl when handling it. Pour the cleansing or wetting solution that your health care provider tells you to use into the bowl. Select and cut your packing material to fit the size of your wound. Avoid using multiple pieces of packing material. Drop it into the bowl. Cut tape strips that you will use to seal the outer dressing, if needed. Put gauze pads for cleansing and cotton-tipped swabs on the clean paper towel. Removing the old packing material and dressing Put on a set of gloves. Gently remove the old dressing and packing material. Make sure to check how the drainage looks or if there is any odor. Clean or rinse (irrigate) the wound. Remove your gloves. Put the removed items, including gloves, into the plastic bag to throw away later. Wash your hands again with soap and water for at least 20 seconds. If soap and water are not available, use hand sanitizer. Applying the new packing material and dressing  Put on a new set of gloves. Squeeze the packing material in the bowl to release the extra liquid. The packing material should be moist, but not dripping wet. Gently place the packing material into the wound. Use a cotton-tipped swab to guide it into place, filling all of the space. Do not overpack the wound bed. Dry your gloved fingertips on the paper towel. Open up your outer dressing supplies and put them on a dry part of the paper towel. Keep them from getting wet. Place the outer  dressing over the packed wound. Tape the edges of the outer dressing in place. Remove your gloves. Wash your hands again with soap and water for at least 20 seconds. If soap and water are not available, use hand sanitizer. Put the removed items, including gloves, into the plastic bag to throw away. Clean and disinfect your work surface or countertop. General tips Follow your health care provider's instructions on how much to pack the wound. At first, you may need to pack it more fully to help stop bleeding. As the wound begins to heal inside, you will use less packing material and pack the wound loosely to allow the tissue to heal slowly from the inside out. Do not take baths, swim, or use a hot tub until your health care provider approves. Ask your health care provider if you may take showers. You may only be allowed to take sponge baths. Keep the dressing clean and dry. Follow any other instructions  given by your health care provider on how to aid healing. This may include applying warm or cold compresses, raising (elevating) the affected area, or wearing a compression dressing. Check your wound site every day for signs of infection. Check for: More redness, swelling, or pain. More fluid or blood. Warmth or hardness (induration). Pus or a bad smell. Protect your wound from the sun when you are outside for the first 6 months, or for as long as told by your health care provider. Cover up the scar area or apply sunscreen that has an SPF of at least 30. Keep all follow-up visits. This is important. Contact a health care provider if: Your pain is not controlled with pain medicine. You have more drainage, redness, swelling, or pain at your wound site. You have new rash, warmth, or induration around the wound. You have a fever or chills. Your wound becomes larger or deeper. Get help right away if: The tissue inside your wound changes color from pink to white, yellow, or black. You notice a bad  smell or pus coming from the wound site. You are having trouble packing your wound. Your wound is bleeding, and the bleeding does not stop with gentle pressure. These symptoms may represent a serious problem that is an emergency. Do not wait to see if the symptoms will go away. Get medical help right away. Call your local emergency services (911 in the [place] Do not drive yourself to the hospital. Summary Wound packing usually involves placing a moistened packing material into your wound and then covering it with an outer bandage (dressing). Follow your health care provider's instructions on how often you need to change dressings and pack your wound. You will likely be asked to change dressings 1 to 2 times a day. When packing your wound, it is important to use gloves to avoid spreading germs into the wound. Check your wound site every day for signs of infection. This information is not intended to replace advice given to you by your health care provider. Make sure you discuss any questions you have with your health care provider. Document Revised: 01/28/2021 Document Reviewed: 01/28/2021 Elsevier Patient Education  2024 [employer].

## 2023-11-09 LAB — ANAEROBIC AND AEROBIC CULTURE

## 2023-11-18 ENCOUNTER — Encounter: Payer: Self-pay | Admitting: Surgery

## 2023-11-18 ENCOUNTER — Ambulatory Visit: Payer: Federal, State, Local not specified - PPO | Admitting: Surgery

## 2023-11-18 VITALS — BP 141/84 | HR 60 | Temp 98.3°F | Ht 70.0 in | Wt 191.8 lb

## 2023-11-18 DIAGNOSIS — Z09 Encounter for follow-up examination after completed treatment for conditions other than malignant neoplasm: Secondary | ICD-10-CM

## 2023-11-18 DIAGNOSIS — L723 Sebaceous cyst: Secondary | ICD-10-CM | POA: Diagnosis not present

## 2023-11-18 NOTE — Patient Instructions (Signed)
Incision and Drainage, Care After After incision and drainage, it is common to have: Pain or discomfort around the incision site. Blood, fluid, or pus (drainage) from the incision. Redness and firm skin around the incision site. Follow these instructions at home: Medicines Take over-the-counter and prescription medicines only as told by your health care provider. If you were prescribed antibiotics, take them as told by your provider. Do not stop using the antibiotic even if you start to feel better. Do not apply creams, ointments, or liquids unless you have been told to by your provider. Wound care Follow instructions from your provider about how to take care of your wound. Make sure you: Wash your hands with soap and water for at least 20 seconds before and after you change your bandage (dressing). If soap and water are not available, use hand sanitizer. Change your dressing and any packing as told by your provider. If the dressing is dry or stuck when you try to remove it, moisten or wet it with saline or water. This will help you remove it without harming your skin or tissues. If your wound is packed, leave it in place until your provider tells you to remove it. To remove it, moisten or wet the packing with saline or water. Leave stitches (sutures), skin glue, or tape strips in place. These skin closures may need to stay in place for 2 weeks or longer. If tape strip edges start to loosen and curl up, you may trim the loose edges. Do not remove tape strips completely unless your provider tells you to do that. Check your wound every day for signs of infection. Check for: More redness, swelling, or pain. More fluid or blood. Warmth. Pus or a bad smell. If you were sent home with a drain tube in place, follow instructions from your provider about: How to empty it. How to care for it at home. Be careful when you get rid of used dressings, wound packing, or drainage. Activity Rest the  affected area. Return to your normal activities as told by your provider. Ask your provider what activities are safe for you. General instructions Do not use any products that contain nicotine or tobacco. These products include cigarettes, chewing tobacco, and vaping devices, such as e-cigarettes. These can delay incision healing after surgery. If you need help quitting, ask your provider. Do not take baths, swim, or use a hot tub until your provider approves. Ask your provider if you may take showers. You may only be allowed to take sponge baths. The incision will keep draining. It is normal to have some clear or slightly bloody drainage. The amount of drainage should go down each day. Keep all follow-up visits. Your provider will need to make sure that your incision is healing well and that there are no problems. Your health care provider may give you more instructions. Make sure you know what you can and cannot do Contact a health care provider if: Your cyst or abscess comes back. You have any signs of infection. You notice red streaks that spread away from the incision site. You have a fever or chills. Get help right away if: You have severe pain or bleeding. You become short of breath. You have chest pain. You have signs of a severe infection. You may notice changes in your incision area, such as: Swelling that makes the skin feel hard. Numbness or tingling. Sudden increase in redness. Your skin color may change from red to purple, and then to dark  spots. Blisters, ulcers, or splitting of the skin. These symptoms may be an emergency. Get help right away. Call 911. Do not wait to see if the symptoms will go away. Do not drive yourself to the hospital. This information is not intended to replace advice given to you by your health care provider. Make sure you discuss any questions you have with your health care provider. Document Revised: 05/11/2022 Document Reviewed: 05/11/2022 Elsevier  Patient Education  2024 [employer].

## 2023-11-18 NOTE — Progress Notes (Signed)
[facility] POST-OP OFFICE VISIT  11/18/2023  HPI: Mark Hill. is a 63 y.o. male had surgery on January 30, now s/p I&D inflamed/abscessed sebaceous cyst of the left mid back.  His wife has been doing a wonderful job with dressing changes on a daily basis.  Reports the amount of packing is becoming less and the wound growing smaller.  No other issues or complaints.  Vital signs: BP (!) 141/84   Pulse 60   Temp 98.3 F (36.8 C) (Oral)   Ht 5\' 10"  (1.778 m)   Wt 191 lb 12.8 oz (87 kg)   SpO2 98%   BMI 27.52 kg/m    Physical Exam: Constitutional: Appears well  Skin: Well-defined 100% granulated wound, with normal healing around it.  Progressing well.  Estimated depth about 1 cm, diameter of wound approximately the same.  No excisional debridement performed today.  Assessment/Plan: This is a 63 y.o. male who had surgery on January 30, now s/p I&D inflamed/abscessed sebaceous cyst of the left mid back.  Progressing well with current dressing changes.  Patient Active Problem List   Diagnosis Date Noted   Cellulitis and abscess of trunk 11/04/2023   Benign prostatic hyperplasia with lower urinary tract symptoms 03/02/2023   Inflamed sebaceous cyst 08/15/2020   Recurrent cystitis 07/30/2015   Neck abscess 02/20/2014    -We reviewed and discussed continuing the same.  Will transition to topical ointment or just cover dressing once it loses its depth.  Anticipating another 3 weeks of wound care; will have him follow-up then.   Campbell Lerner M.D., FACS 11/18/2023, 10:38 AM

## 2023-12-08 ENCOUNTER — Ambulatory Visit: Admitting: Family Medicine

## 2023-12-08 ENCOUNTER — Ambulatory Visit: Payer: Self-pay | Admitting: Family Medicine

## 2023-12-08 ENCOUNTER — Encounter: Payer: Self-pay | Admitting: Family Medicine

## 2023-12-08 VITALS — BP 137/79 | HR 78 | Temp 99.5°F | Resp 16 | Wt 194.0 lb

## 2023-12-08 DIAGNOSIS — J069 Acute upper respiratory infection, unspecified: Secondary | ICD-10-CM | POA: Diagnosis not present

## 2023-12-08 DIAGNOSIS — J189 Pneumonia, unspecified organism: Secondary | ICD-10-CM | POA: Diagnosis not present

## 2023-12-08 LAB — POC COVID19/FLU A&B COMBO
Covid Antigen, POC: NEGATIVE
Influenza A Antigen, POC: NEGATIVE
Influenza B Antigen, POC: NEGATIVE

## 2023-12-08 MED ORDER — PROMETHAZINE-DM 6.25-15 MG/5ML PO SYRP
5.0000 mL | ORAL_SOLUTION | Freq: Four times a day (QID) | ORAL | 0 refills | Status: DC | PRN
Start: 2023-12-08 — End: 2024-02-03

## 2023-12-08 MED ORDER — AZITHROMYCIN 250 MG PO TABS: ORAL_TABLET | ORAL | 0 refills | Status: AC

## 2023-12-08 MED ORDER — AMOXICILLIN-POT CLAVULANATE 875-125 MG PO TABS: 1.0000 | ORAL_TABLET | Freq: Two times a day (BID) | ORAL | 0 refills | Status: DC

## 2023-12-08 MED ORDER — ALBUTEROL SULFATE HFA 108 (90 BASE) MCG/ACT IN AERS: 2.0000 | INHALATION_SPRAY | Freq: Four times a day (QID) | RESPIRATORY_TRACT | 2 refills | Status: DC | PRN

## 2023-12-08 NOTE — Telephone Encounter (Signed)
 Copied from CRM [id number]. Topic: Clinical - Red Word Triage >> Dec 08, 2023  8:56 AM Elle L wrote: Red Word that prompted transfer to Nurse Triage: The patient has a worsening cough and is having rapid cough episodes where he is unable to stop coughing. He is concerned that he has pneumonia. He has body chills and a low grade fever as well. Reason for Disposition  [1] Continuous (nonstop) coughing interferes with work or school AND [2] no improvement using cough treatment per Care Advice  Answer Assessment - Initial Assessment Questions 1. ONSET: "When did the cough begin?"      Started last couple of days, over the weekend.   I'm taking OTC meds thinking I had a cold. But I'm having rapid coughing spells 2. SEVERITY: "How bad is the cough today?"      I'm having coughing fits.   I wonder if I have pneumonia.   It does not hurt to cough.   Mild chills. 3. SPUTUM: "Describe the color of your sputum" (none, dry cough; clear, white, yellow, green)     Not noticed  I'm swallowing it. 4. HEMOPTYSIS: "Are you coughing up any blood?" If so ask: "How much?" (flecks, streaks, tablespoons, etc.)     Not aksed 5. DIFFICULTY BREATHING: "Are you having difficulty breathing?" If Yes, ask: "How bad is it?" (e.g., mild, moderate, severe)    - MILD: No SOB at rest, mild SOB with walking, speaks normally in sentences, can lie down, no retractions, pulse < 100.    - MODERATE: SOB at rest, SOB with minimal exertion and prefers to sit, cannot lie down flat, speaks in phrases, mild retractions, audible wheezing, pulse 100-120.    - SEVERE: Very SOB at rest, speaks in single words, struggling to breathe, sitting hunched forward, retractions, pulse > 120      No  6. FEVER: "Do you have a fever?" If Yes, ask: "What is your temperature, how was it measured, and when did it start?"     Mild chills.   Body aches and tired  7. CARDIAC HISTORY: "Do you have any history of heart disease?" (e.g., heart attack, congestive heart  failure)      N/A 8. LUNG HISTORY: "Do you have any history of lung disease?"  (e.g., pulmonary embolus, asthma, emphysema)     N/A 9. PE RISK FACTORS: "Do you have a history of blood clots?" (or: recent major surgery, recent prolonged travel, bedridden)     N/A 10. OTHER SYMPTOMS: "Do you have any other symptoms?" (e.g., runny nose, wheezing, chest pain)       Sneezing, runny nose.  No sore throat 11. PREGNANCY: "Is there any chance you are pregnant?" "When was your last menstrual period?"       N/A 12. TRAVEL: "Have you traveled out of the country in the last month?" (e.g., travel history, exposures)       N/A  Protocols used: Cough - Acute Productive-A-AH  Chief Complaint: coughing fits getting worse Symptoms: mild chills, fatigue Frequency: Started over the weekend Pertinent Negatives: Patient denies sore throat, ear pain Disposition: [] ED /[] Urgent Care (no appt availability in office) / [x] Appointment(In office/virtual)/ []  [facility]/ [] Home Care/ [] Refused Recommended Disposition /[] [facility]/ []  Follow-up with PCP Additional Notes: Appt made

## 2023-12-08 NOTE — Patient Instructions (Signed)
 VISIT SUMMARY:  Today, you were seen for a worsening cough and concerns about pneumonia. You have been experiencing rapid coughing episodes, occasional chills, and a low-grade fever. After a thorough examination, it was determined that you likely have atypical pneumonia, also known as walking pneumonia.  YOUR PLAN:  -ATYPICAL PNEUMONIA: Atypical pneumonia, or walking pneumonia, is a milder form of pneumonia that can still cause significant symptoms like a persistent cough and low-grade fever. To confirm the diagnosis and assess your lung condition, a chest x-ray has been ordered. You have been prescribed azithromycin (500 mg on day 1, followed by 250 mg daily for the next four days) and Augmentin (875-125 mg twice daily for five days) to treat the infection. Additionally, albuterol has been prescribed to help improve your breathing. Please follow up with your primary care provider in one week if your symptoms do not improve.  INSTRUCTIONS:  Please get a chest x-ray as soon as possible to confirm the diagnosis and assess your lung condition. Follow the prescribed medication regimen: azithromycin (500 mg on day 1, followed by 250 mg daily for the next four days) and Augmentin (875-125 mg twice daily for five days). Use albuterol as needed to help with breathing. If your symptoms do not improve, schedule a follow-up appointment with your primary care provider in one week.  For more information, you can read your full clinical note, available in your patient portal.

## 2023-12-08 NOTE — Progress Notes (Signed)
 Established patient visit   Patient: Mark Hill.   DOB: 09/11/1961   63 y.o. Male  MRN: [id number] Visit Date: 12/08/2023  Today's healthcare provider: Ronnald Ramp, MD   Chief Complaint  Patient presents with   Cough    patient has a worsening cough and is having rapid cough episodes where he is unable to stop coughing. He is concerned that he has pneumonia. He has body chills off and on.  Patient has been taking IBU prn and takes allergy pill everyday.   Subjective     HPI     Cough    Additional comments: patient has a worsening cough and is having rapid cough episodes where he is unable to stop coughing. He is concerned that he has pneumonia. He has body chills off and on.  Patient has been taking IBU prn and takes allergy pill everyday.      Last edited by Marjie Skiff, CMA on 12/08/2023  9:39 AM.       Discussed the use of AI scribe software for clinical note transcription with the patient, who gave verbal consent to proceed.  History of Present Illness   Mark Hill. "Mark Hill" is a 63 year old male who presents with worsening cough and concerns about pneumonia.  He has been experiencing worsening episodes of rapid coughing that are difficult to stop. The cough is mostly dry but occasionally productive. The duration of the cough is unspecified.  He experiences intermittent body chills, particularly while at work, but he does not last long. No chest tightness or shortness of breath. He has a temperature of 99.84F.  He has a little nasal congestion and describes a 'coolness around his nose' that requires him to blow his nose. No ear pain, sore throat, or diarrhea.  No back pain. He has some energy, though he feels a little tired, which he attributes to his job. His appetite is good, and he is able to taste normally.         Past Medical History:  Diagnosis Date   Allergy    seasonal    Medications: Outpatient Medications  Prior to Visit  Medication Sig   cetirizine (ZYRTEC) 10 MG tablet Take 10 mg by mouth daily.   finasteride (PROSCAR) 5 MG tablet Take 1 tablet by mouth daily.   ibuprofen (ADVIL,MOTRIN) 200 MG tablet Take 400 mg by mouth every 6 (six) hours as needed for headache.    latanoprost (XALATAN) 0.005 % ophthalmic solution SMARTSIG:In Eye(s)   Probiotic Product (PROBIOTIC DAILY PO) Take by mouth.   RAPAFLO 8 MG CAPS capsule Take 1 capsule by mouth daily.   No facility-administered medications prior to visit.    Review of Systems  Last CBC No results found for: "WBC", "HGB", "HCT", "MCV", "MCH", "RDW", "PLT" Last metabolic panel No results found for: "GLUCOSE", "NA", "K", "CL", "CO2", "BUN", "CREATININE", "EGFR", "CALCIUM", "PHOS", "PROT", "ALBUMIN", "LABGLOB", "AGRATIO", "BILITOT", "ALKPHOS", "AST", "ALT", "ANIONGAP"      Objective    BP 137/79 (BP Location: Left Arm, Patient Position: Sitting, Cuff Size: Normal)   Pulse 78   Temp 99.5 F (37.5 C) (Oral)   Resp 16   Wt 194 lb (88 kg)   SpO2 95%   BMI 27.84 kg/m  BP Readings from Last 3 Encounters:  12/08/23 137/79  11/18/23 (!) 141/84  11/04/23 (!) 155/88   Wt Readings from Last 3 Encounters:  12/08/23 194 lb (88 kg)  11/18/23 191  lb 12.8 oz (87 kg)  11/04/23 192 lb 3.2 oz (87.2 kg)        Physical Exam  Physical Exam   VITALS: T- 99.5, RR- 16, SaO2- 95% HEENT: No tympanic membrane erythema. No maxillary or frontal sinus tenderness. Nasal congestion. CHEST: Wheezing in bilateral lower to mid lung fields, no crackles. Normal respiratory effort. CARDIOVASCULAR: Regular rhythm.       Results for orders placed or performed in visit on 12/08/23  POC Covid19/Flu A&B Antigen  Result Value Ref Range   Influenza A Antigen, POC Negative Negative   Influenza B Antigen, POC Negative Negative   Covid Antigen, POC Negative Negative    Assessment & Plan     Problem List Items Addressed This Visit   None Visit Diagnoses        Community acquired pneumonia, unspecified laterality    -  Primary   Relevant Medications   amoxicillin-clavulanate (AUGMENTIN) 875-125 MG tablet   azithromycin (ZITHROMAX) 250 MG tablet   promethazine-dextromethorphan (PROMETHAZINE-DM) 6.25-15 MG/5ML syrup   albuterol (VENTOLIN HFA) 108 (90 Base) MCG/ACT inhaler   Other Relevant Orders   DG Chest 2 View     Acute URI       Relevant Medications   azithromycin (ZITHROMAX) 250 MG tablet   Other Relevant Orders   POC Covid19/Flu A&B Antigen (Completed)      Atypical Pneumonia Presents with worsening cough, rapid coughing episodes, and intermittent chills. Low-grade fever of 99.27F and oxygen saturation of 95%. Wheezing in the bilateral lower to mid lung fields without crackles. Clinical presentation suggests community-acquired atypical pneumonia, fitting the profile of walking pneumonia. Decision to treat as atypical pneumonia is based on symptomatology and potential for rapid progression if untreated. Discussed treatment options including antibiotics and steroids, with preference for antibiotics due to potential rapid progression and similar family cases. - Order chest x-ray to confirm diagnosis and assess lung condition - Prescribe azithromycin 500 mg on day 1, followed by 250 mg daily for the next four days - Prescribe Augmentin 875-125 mg twice daily for five days - Prescribe albuterol to improve airway patency and breathing - Advise follow-up with primary care provider in one week if symptoms do not improve         Return in about 1 week (around 12/15/2023), or if symptoms worsen or fail to improve.         Ronnald Ramp, MD  [facility] [phone] (phone) [phone] (fax)  [facility]

## 2023-12-09 ENCOUNTER — Ambulatory Visit: Payer: Federal, State, Local not specified - PPO | Admitting: Surgery

## 2023-12-10 ENCOUNTER — Telehealth: Payer: Self-pay | Admitting: Family Medicine

## 2023-12-10 NOTE — Telephone Encounter (Signed)
Please see the message below and advise.

## 2023-12-10 NOTE — Telephone Encounter (Signed)
 Copied from CRM [id number]. Topic: General - Other >> Dec 10, 2023 12:40 PM Higinio Roger wrote: Reason for CRM: Patient is requesting an extension on his doctor's note for today, 12/10/23. He states he is still not feeling well. Callback #: [phone]

## 2023-12-10 NOTE — Telephone Encounter (Signed)
 Extended work note sent via [employer]

## 2024-02-03 ENCOUNTER — Ambulatory Visit: Admitting: Urology

## 2024-02-03 VITALS — BP 126/81 | HR 66 | Ht 70.0 in | Wt 192.0 lb

## 2024-02-03 DIAGNOSIS — Z87440 Personal history of urinary (tract) infections: Secondary | ICD-10-CM | POA: Diagnosis not present

## 2024-02-03 DIAGNOSIS — Z87438 Personal history of other diseases of male genital organs: Secondary | ICD-10-CM

## 2024-02-03 DIAGNOSIS — N401 Enlarged prostate with lower urinary tract symptoms: Secondary | ICD-10-CM

## 2024-02-03 DIAGNOSIS — R350 Frequency of micturition: Secondary | ICD-10-CM | POA: Diagnosis not present

## 2024-02-03 DIAGNOSIS — Z125 Encounter for screening for malignant neoplasm of prostate: Secondary | ICD-10-CM | POA: Diagnosis not present

## 2024-02-03 DIAGNOSIS — R972 Elevated prostate specific antigen [PSA]: Secondary | ICD-10-CM | POA: Diagnosis not present

## 2024-02-03 LAB — BLADDER SCAN AMB NON-IMAGING: Scan Result: 658

## 2024-02-03 NOTE — Progress Notes (Signed)
 Mark Hill,acting as a scribe for Mark Gimenez, MD.,have documented all relevant documentation on the behalf of Mark Gimenez, MD,as directed by  Mark Gimenez, MD while in the presence of Mark Gimenez, MD.  02/03/24 2:47 PM   Mark Hill. 11/15/1960 [id number]  Referring provider: Lamon Pillow, MD [address] [place],  [place] [address]  Chief Complaint  Patient presents with   Benign Prostatic Hypertrophy    HPI:  63 year old male with a personal history of BPH presents today to establish care. He was previously managed by Dr. Fredrick Hill and has been on Rapaflo and Flomax. His last visit with Dr. Fredrick Hill was in November 2023.   He has a history of prostatitis and recurrent UTIs, with the last UTI treated about a year ago. He reports feeling like he is emptying his bladder better now and denies any pain or discomfort.  His IPSS is excellent, but his PVR is markedly elevated at 658 mL. A UA today was negative.   His last PSA level was 1.4 in November 2023.   In 2017, he was found to be in urinary retention and was taught to self-catheterize twice a day. A previous Uroflow showed a PVR of 255 mL, a maximum flow rate of 11 mL/s, and an average flow rate of 4.8 mL/s.  Extensive record review today.  Results for orders placed or performed in visit on 02/03/24  Bladder Scan (Post Void Residual) in office  Result Value Ref Range   Scan Result 658 ml     IPSS     Row Name 02/03/24 1400         International Prostate Symptom Score   How often have you had the sensation of not emptying your bladder? Less than 1 in 5     How often have you had to urinate less than every two hours? Less than 1 in 5 times     How often have you found you stopped and started again several times when you urinated? Not at All     How often have you found it difficult to postpone urination? Not at All     How often have you had a weak urinary stream? Not at All     How  often have you had to strain to start urination? Not at All     How many times did you typically get up at night to urinate? None     Total IPSS Score 2       Quality of Life due to urinary symptoms   If you were to spend the rest of your life with your urinary condition just the way it is now how would you feel about that? Pleased              Score:  1-7 Mild 8-19 Moderate 20-35 Severe    PMH: Past Medical History:  Diagnosis Date   Allergy    seasonal    Surgical History: Past Surgical History:  Procedure Laterality Date   COLONOSCOPY N/A 03/29/2015   Procedure: COLONOSCOPY;  Surgeon: Deveron Fly, MD;  Location: [facility] ENDOSCOPY;  Service: Endoscopy;  Laterality: N/A;   SHOULDER SURGERY Right 1981   SKIN LESION EXCISION  05-16-2010   back cyst   TONSILLECTOMY      Home Medications:  Allergies as of 02/03/2024       Reactions   Septra  [sulfamethoxazole -trimethoprim ] Rash        Medication List  Accurate as of Feb 03, 2024  2:47 PM. If you have any questions, ask your nurse or doctor.          STOP taking these medications    albuterol  108 (90 Base) MCG/ACT inhaler Commonly known as: VENTOLIN  HFA   amoxicillin -clavulanate 875-125 MG tablet Commonly known as: AUGMENTIN    promethazine -dextromethorphan 6.25-15 MG/5ML syrup Commonly known as: PROMETHAZINE -DM       TAKE these medications    cetirizine 10 MG tablet Commonly known as: ZYRTEC Take 10 mg by mouth daily.   finasteride 5 MG tablet Commonly known as: PROSCAR Take 1 tablet by mouth daily.   ibuprofen 200 MG tablet Commonly known as: ADVIL Take 400 mg by mouth every 6 (six) hours as needed for headache.   latanoprost 0.005 % ophthalmic solution Commonly known as: XALATAN SMARTSIG:In Eye(s)   PROBIOTIC DAILY PO Take by mouth.   Rapaflo 8 MG Caps capsule Generic drug: silodosin Take 1 capsule by mouth daily.        Allergies:  Allergies  Allergen Reactions    Septra  [Sulfamethoxazole -Trimethoprim ] Rash    Family History: Family History  Problem Relation Age of Onset   Diabetes Brother    Congestive Heart Failure Father    Heart attack Father        occured in his 65's   Heart disease Father     Social History:  reports that he has never smoked. He has never used smokeless tobacco. He reports current alcohol use. He reports that he does not use drugs.   Physical Exam: BP 126/81   Pulse 66   Ht 5\' 10"  (1.778 m)   Wt 192 lb (87.1 kg)   BMI 27.55 kg/m   Constitutional:  Alert and oriented, No acute distress. HEENT: [place] AT, moist mucus membranes.  Trachea midline, no masses. GU: Prostate relatively small. Neurologic: Grossly intact, no focal deficits, moving all 4 extremities. Psychiatric: Normal mood and affect.   Assessment & Plan:    1. BPH with Chronic Urinary Retention - He has a history of BPH and is currently managed on Rapaflo and finasteride. Despite medication, he is not emptying his bladder completely, as evidenced by a high PVR and a history of self-catheterization.  - The concern is for potential bladder dysfunction, possibly neurogenic, due to chronic retention.  - Plan includes obtaining a renal ultrasound to evaluate his upper tracts and basic labs to evaluate kidney function. Labs will include CBC, CMP, A1C, and PSA - A rectal exam was performed today, confirming a relatively small prostate.  - Follow-up in two weeks to discuss results and potential further management, including possible prostate intervention or specialized bladder testing (UDS).  2. PSA Screening - His last PSA was 1.4 in November 2023. A repeat PSA test is ordered to monitor for any changes, given the history of elevated PSA levels.  3. History of Recurrent UTIs and Prostatitis - He has a history of recurrent UTIs and prostatitis, likely related to incomplete bladder emptying.  - Monitoring for signs of infection and addressing bladder emptying  issues are part of the management plan.  2 weeks for follow-up labs/renal ultrasound   [facility] [address], [address] [place], [place] [address] [phone]

## 2024-02-03 NOTE — Patient Instructions (Signed)
 Call scheduling [phone]

## 2024-02-04 LAB — COMPREHENSIVE METABOLIC PANEL WITH GFR
ALT: 24 IU/L (ref 0–44)
AST: 20 IU/L (ref 0–40)
Albumin: 4.6 g/dL (ref 3.9–4.9)
Alkaline Phosphatase: 64 IU/L (ref 44–121)
BUN/Creatinine Ratio: 17 (ref 10–24)
BUN: 15 mg/dL (ref 8–27)
Bilirubin Total: 0.5 mg/dL (ref 0.0–1.2)
CO2: 27 mmol/L (ref 20–29)
Calcium: 9.2 mg/dL (ref 8.6–10.2)
Chloride: 104 mmol/L (ref 96–106)
Creatinine, Ser: 0.9 mg/dL (ref 0.76–1.27)
Globulin, Total: 2.2 g/dL (ref 1.5–4.5)
Glucose: 88 mg/dL (ref 70–99)
Potassium: 5.4 mmol/L — ABNORMAL HIGH (ref 3.5–5.2)
Sodium: 140 mmol/L (ref 134–144)
Total Protein: 6.8 g/dL (ref 6.0–8.5)
eGFR: 96 mL/min/{1.73_m2} (ref >59)

## 2024-02-04 LAB — MICROSCOPIC EXAMINATION: Bacteria, UA: NONE SEEN

## 2024-02-04 LAB — URINALYSIS, COMPLETE
Bilirubin, UA: NEGATIVE
Glucose, UA: NEGATIVE
Ketones, UA: NEGATIVE
Leukocytes,UA: NEGATIVE
Nitrite, UA: NEGATIVE
Protein,UA: NEGATIVE
RBC, UA: NEGATIVE
Specific Gravity, UA: 1.01 (ref 1.005–1.030)
Urobilinogen, Ur: 0.2 mg/dL (ref 0.2–1.0)
pH, UA: 6 (ref 5.0–7.5)

## 2024-02-04 LAB — CBC
Hematocrit: 47 % (ref 37.5–51.0)
Hemoglobin: 16 g/dL (ref 13.0–17.7)
MCH: 30.7 pg (ref 26.6–33.0)
MCHC: 34 g/dL (ref 31.5–35.7)
MCV: 90 fL (ref 79–97)
Platelets: 162 10*3/uL (ref 150–450)
RBC: 5.21 x10E6/uL (ref 4.14–5.80)
RDW: 13.5 % (ref 11.6–15.4)
WBC: 4.3 10*3/uL (ref 3.4–10.8)

## 2024-02-04 LAB — HEMOGLOBIN A1C
Est. average glucose Bld gHb Est-mCnc: 123 mg/dL
Hgb A1c MFr Bld: 5.9 % — ABNORMAL HIGH (ref 4.8–5.6)

## 2024-02-04 LAB — PSA: Prostate Specific Ag, Serum: 0.1 ng/mL (ref 0.0–4.0)

## 2024-02-21 ENCOUNTER — Telehealth: Payer: Self-pay | Admitting: Urology

## 2024-02-21 NOTE — Telephone Encounter (Signed)
 He wants to know if he can get his Finasteride and Silodidosin refilled. But he would like to talk to someone about some all natural supplements that he could possible take instead of prescriptions that may have side affects.

## 2024-02-21 NOTE — Telephone Encounter (Signed)
 Spoke with patient. Patient states a friend of his with similar urinary issues has been taking Beta-sitosterol supplement and it has helped him a lot so patient started taking this also. Patient also was looking into ProstaGenix and would like to know Dr Jeni Mitten thoughts on these 2 options of supplements? He would rather take natural supplements over the medications if possible.  Patient also cancelled his follow up appointment that was scheduled for this Thursday 5/22 as he did not get US  done and would not be able to make it for that appointment either way. Patient is not planing on scheduling US  at this time as he feels that his urinary retention was present due to him been out of Silodosin for a month before his appointment. He does not think US  is neccessary at this time. Patient would like feedback on both matters when possible.

## 2024-02-22 NOTE — Telephone Encounter (Signed)
 There is no specific evidence that any of these supplements are effective.  The data on saw palmetto however is somewhat more promising and this might be something to try.  In most of these formulations, saw palmetto is the primary ingredient.  Obviously, strongly encouraged him to get the renal ultrasound.  It is to evaluate his upper tracts and make sure that they are not dilated or he is not experiencing any deleterious/harmful kidney conditions related to his inability to empty the bladder.  Dustin Gimenez, MD

## 2024-02-23 MED ORDER — SILODOSIN 8 MG PO CAPS
8.0000 mg | ORAL_CAPSULE | Freq: Every day | ORAL | 11 refills | Status: AC
Start: 2024-02-23 — End: ?

## 2024-02-23 MED ORDER — FINASTERIDE 5 MG PO TABS: 5.0000 mg | ORAL_TABLET | Freq: Every day | ORAL | 11 refills | Status: AC

## 2024-02-23 NOTE — Telephone Encounter (Signed)
 Left detailed message on patient's voicemail ok per DPR on file. Refills for medications sent in

## 2024-02-24 ENCOUNTER — Ambulatory Visit: Admitting: Urology

## 2024-09-29 ENCOUNTER — Ambulatory Visit
Admission: RE | Admit: 2024-09-29 | Discharge: 2024-09-29 | Disposition: A | Discharge status: Home / Self Care | Payer: Self-pay | Source: Ambulatory Visit | Attending: Emergency Medicine | Admitting: Emergency Medicine

## 2024-09-29 VITALS — BP 129/70 | HR 74 | Temp 98.7°F | Resp 18

## 2024-09-29 DIAGNOSIS — R319 Hematuria, unspecified: Secondary | ICD-10-CM | POA: Insufficient documentation

## 2024-09-29 DIAGNOSIS — R030 Elevated blood-pressure reading, without diagnosis of hypertension: Secondary | ICD-10-CM | POA: Diagnosis not present

## 2024-09-29 DIAGNOSIS — N390 Urinary tract infection, site not specified: Secondary | ICD-10-CM | POA: Diagnosis not present

## 2024-09-29 LAB — POCT URINE DIPSTICK
Bilirubin, UA: NEGATIVE
Glucose, UA: 100 mg/dL — AB
Nitrite, UA: POSITIVE — AB
Protein Ur, POC: 30 mg/dL — AB
Spec Grav, UA: <=1.005 — AB
Urobilinogen, UA: 1 U/dL
pH, UA: 5.5

## 2024-09-29 MED ORDER — CEPHALEXIN 500 MG PO CAPS: 500.0000 mg | ORAL_CAPSULE | Freq: Three times a day (TID) | ORAL | 0 refills | Status: AC

## 2024-09-29 NOTE — ED Triage Notes (Signed)
 Urinary frequency, painful urination, odor and blood in urine, lower abdominal pressure x 2 days.

## 2024-09-29 NOTE — ED Provider Notes (Signed)
 " Mark Hill    CSN: [id number] Arrival date & time: 09/29/24  0930      History   Chief Complaint Chief Complaint  Patient presents with   Urinary Frequency    Have symptoms of a UTI includes pain when urinating and a distinct odor. Had a fever last night but its normal today. - Entered by patient    HPI Mark Hill. is a 63 y.o. male.  Patient presents with 2-day history of dysuria, urinary frequency, malodorous urine, blood in urine, bladder pressure.  He has been treating his symptoms with Azo; last taken yesterday.  No difficulty with urine stream.  No fever, abdominal pain, flank pain.  His medical history includes BPH with urinary frequency, history of prostatitis, history of recurrent UTIs.  The history is provided by the patient and medical records.    Past Medical History:  Diagnosis Date   Allergy    seasonal    Patient Active Problem List   Diagnosis Date Noted   Cellulitis and abscess of trunk 11/04/2023   Benign prostatic hyperplasia with lower urinary tract symptoms 03/02/2023   Inflamed sebaceous cyst 08/15/2020   Recurrent cystitis 07/30/2015   Neck abscess 02/20/2014    Past Surgical History:  Procedure Laterality Date   COLONOSCOPY N/A 03/29/2015   Procedure: COLONOSCOPY;  Surgeon: Gladis RAYMOND Mariner, MD;  Location: [facility] ENDOSCOPY;  Service: Endoscopy;  Laterality: N/A;   SHOULDER SURGERY Right 1981   SKIN LESION EXCISION  05-16-2010   back cyst   TONSILLECTOMY         Home Medications    Prior to Admission medications  Medication Sig Start Date End Date Taking? Authorizing Provider  cephALEXin  (KEFLEX ) 500 MG capsule Take 1 capsule (500 mg total) by mouth 3 (three) times daily for 5 days. 09/29/24 10/04/24 Yes Corlis Burnard DEL, NP  cetirizine (ZYRTEC) 10 MG tablet Take 10 mg by mouth daily.   Yes [provider]  finasteride  (PROSCAR ) 5 MG tablet Take 1 tablet (5 mg total) by mouth daily. 02/23/24  Yes Penne Knee,  MD  Probiotic Product (PROBIOTIC DAILY PO) Take by mouth.   Yes [provider]  silodosin  (RAPAFLO ) 8 MG CAPS capsule Take 1 capsule (8 mg total) by mouth daily. 02/23/24  Yes Penne Knee, MD  ibuprofen (ADVIL,MOTRIN) 200 MG tablet Take 400 mg by mouth every 6 (six) hours as needed for headache.     [provider]  latanoprost (XALATAN) 0.005 % ophthalmic solution SMARTSIG:In Eye(s) 08/12/20   [provider]    Family History Family History  Problem Relation Age of Onset   Diabetes Brother    Congestive Heart Failure Father    Heart attack Father        occured in his 24's   Heart disease Father     Social History Social History[1]   Allergies   Septra  [sulfamethoxazole -trimethoprim ]   Review of Systems Review of Systems  Constitutional:  Negative for chills and fever.  Gastrointestinal:  Negative for abdominal pain.  Genitourinary:  Positive for dysuria, frequency and hematuria. Negative for flank pain.     Physical Exam Triage Vital Signs ED Triage Vitals  Encounter Vitals Group     BP      Girls Systolic BP Percentile      Girls Diastolic BP Percentile      Boys Systolic BP Percentile      Boys Diastolic BP Percentile      Pulse  Resp      Temp      Temp src      SpO2      Weight      Height      Head Circumference      Peak Flow      Pain Score      Pain Loc      Pain Education      Exclude from Growth Chart    No data found.  Updated Vital Signs BP 129/70 (BP Location: Right Arm)   Pulse 74   Temp 98.7 F (37.1 C) (Oral)   Resp 18   SpO2 98%   Visual Acuity Right Eye Distance:   Left Eye Distance:   Bilateral Distance:    Right Eye Near:   Left Eye Near:    Bilateral Near:     Physical Exam Constitutional:      General: He is not in acute distress. HENT:     Mouth/Throat:     Mouth: Mucous membranes are moist.  Cardiovascular:     Rate and Rhythm: Normal rate.  Pulmonary:     Effort: Pulmonary  effort is normal. No respiratory distress.  Abdominal:     General: Bowel sounds are normal.     Palpations: Abdomen is soft.     Tenderness: There is no abdominal tenderness. There is no right CVA tenderness, left CVA tenderness, guarding or rebound.  Neurological:     Mental Status: He is alert.      UC Treatments / Results  Labs (all labs ordered are listed, but only abnormal results are displayed) Labs Reviewed  POCT URINE DIPSTICK - Abnormal; Notable for the following components:      Result Value   Color, UA orange (*)    Glucose, UA =100 (*)    Ketones, POC UA trace (5) (*)    Spec Grav, UA <=1.005 (*)    Blood, UA moderate (*)    Protein Ur, POC =30 (*)    Nitrite, UA Positive (*)    Leukocytes, UA Small (1+) (*)    All other components within normal limits  URINE CULTURE    EKG   Radiology No results found.  Procedures Procedures (including critical care time)  Medications Ordered in UC Medications - No data to display  Initial Impression / Assessment and Plan / UC Course  I have reviewed the triage vital signs and the nursing notes.  Pertinent labs & imaging results that were available during my care of the patient were reviewed by me and considered in my medical decision making (see chart for details).    UTI with hematuria, elevated blood pressure reading.  Treating UTI with cephalexin  (patient is allergic to Septra ).  Urine culture pending and discussed with patient that we will call if it shows the need to change the antibiotic.  Instructed him to follow-up with his PCP or urologist on Monday.  ED precautions given.  Education provided on UTI.  Blood pressure initially elevated on arrival but returned to normal on recheck.  Instructed patient to have this rechecked by his PCP in 2 to 4 weeks.  Education provided on preventing hypertension.  He agrees to plan of care.  Final Clinical Impressions(s) / UC Diagnoses   Final diagnoses:  Urinary tract  infection with hematuria, site unspecified  Elevated blood pressure reading     Discharge Instructions      Take the antibiotic as directed.  The urine culture is pending.  We will call you if it shows the need to change or discontinue your antibiotic.    Follow up with your primary care provider or urologist on Monday.  Go to the emergency department if you have worsening symptoms.    Your blood pressure is elevated today at 172/60; repeat 129/70.  Please have this rechecked by your primary care provider in 2-4 weeks.          ED Prescriptions     Medication Sig Dispense Auth. Provider   cephALEXin  (KEFLEX ) 500 MG capsule Take 1 capsule (500 mg total) by mouth 3 (three) times daily for 5 days. 15 capsule Corlis Burnard DEL, NP      PDMP not reviewed this encounter.    [1]  Social History Tobacco Use   Smoking status: Never   Smokeless tobacco: Never  Substance Use Topics   Alcohol use: Yes    Alcohol/week: 0.0 standard drinks of alcohol    Comment: ocassionally on weekends   Drug use: No     Corlis Burnard DEL, NP 09/29/24 1036  "

## 2024-09-29 NOTE — Discharge Instructions (Signed)
 Take the antibiotic as directed.  The urine culture is pending.  We will call you if it shows the need to change or discontinue your antibiotic.    Follow up with your primary care provider or urologist on Monday.  Go to the emergency department if you have worsening symptoms.    Your blood pressure is elevated today at 172/60; repeat 129/70.  Please have this rechecked by your primary care provider in 2-4 weeks.

## 2024-10-01 LAB — URINE CULTURE: Culture: >=100000 — AB

## 2024-10-02 ENCOUNTER — Ambulatory Visit (HOSPITAL_COMMUNITY): Payer: Self-pay

## 2024-10-02 MED ORDER — CEFPODOXIME PROXETIL 200 MG PO TABS: 200.0000 mg | ORAL_TABLET | Freq: Two times a day (BID) | ORAL | 0 refills | Status: AC

## 2024-10-02 NOTE — Telephone Encounter (Signed)
 Discontinue Keflex , begin Vantin twice daily x 7 days.  Prescription sent to pharmacy in [facility] on [address] in [place].
